# Patient Record
Sex: Female | Born: 1935 | Race: White | Hispanic: No | Marital: Married | Smoking: Former smoker
Health system: Southern US, Community
[De-identification: ages and names within clinical notes are randomized; demographics above are authoritative.]

## PROBLEM LIST (undated history)

## (undated) DIAGNOSIS — H8110 Benign paroxysmal vertigo, unspecified ear: Secondary | ICD-10-CM

## (undated) DIAGNOSIS — M810 Age-related osteoporosis without current pathological fracture: Secondary | ICD-10-CM

## (undated) DIAGNOSIS — G250 Essential tremor: Secondary | ICD-10-CM

## (undated) DIAGNOSIS — K219 Gastro-esophageal reflux disease without esophagitis: Secondary | ICD-10-CM

## (undated) DIAGNOSIS — K644 Residual hemorrhoidal skin tags: Secondary | ICD-10-CM

## (undated) DIAGNOSIS — M4850XA Collapsed vertebra, not elsewhere classified, site unspecified, initial encounter for fracture: Secondary | ICD-10-CM

## (undated) DIAGNOSIS — R350 Frequency of micturition: Secondary | ICD-10-CM

## (undated) DIAGNOSIS — F0280 Dementia in other diseases classified elsewhere without behavioral disturbance: Secondary | ICD-10-CM

## (undated) DIAGNOSIS — R911 Solitary pulmonary nodule: Secondary | ICD-10-CM

## (undated) DIAGNOSIS — I10 Essential (primary) hypertension: Secondary | ICD-10-CM

## (undated) DIAGNOSIS — E559 Vitamin D deficiency, unspecified: Secondary | ICD-10-CM

## (undated) DIAGNOSIS — M8448XA Pathological fracture, other site, initial encounter for fracture: Secondary | ICD-10-CM

## (undated) DIAGNOSIS — G309 Alzheimer's disease, unspecified: Secondary | ICD-10-CM

## (undated) DIAGNOSIS — F329 Major depressive disorder, single episode, unspecified: Secondary | ICD-10-CM

## (undated) DIAGNOSIS — F411 Generalized anxiety disorder: Secondary | ICD-10-CM

## (undated) DIAGNOSIS — B029 Zoster without complications: Secondary | ICD-10-CM

## (undated) DIAGNOSIS — K5730 Diverticulosis of large intestine without perforation or abscess without bleeding: Secondary | ICD-10-CM

## (undated) DIAGNOSIS — R079 Chest pain, unspecified: Secondary | ICD-10-CM

## (undated) DIAGNOSIS — G43001 Migraine without aura, not intractable, with status migrainosus: Secondary | ICD-10-CM

## (undated) DIAGNOSIS — R269 Unspecified abnormalities of gait and mobility: Secondary | ICD-10-CM

## (undated) DIAGNOSIS — E7800 Pure hypercholesterolemia, unspecified: Secondary | ICD-10-CM

## (undated) HISTORY — PX: CARPAL TUNNEL RELEASE: SHX101

## (undated) HISTORY — DX: Solitary pulmonary nodule: R91.1

## (undated) HISTORY — DX: Vitamin D deficiency, unspecified: E55.9

## (undated) HISTORY — DX: Gastro-esophageal reflux disease without esophagitis: K21.9

## (undated) HISTORY — PX: BILATERAL SALPINGOOPHORECTOMY: SHX1223

## (undated) HISTORY — DX: Unspecified abnormalities of gait and mobility: R26.9

## (undated) HISTORY — DX: Frequency of micturition: R35.0

## (undated) HISTORY — DX: Pathological fracture, other site, initial encounter for fracture: M84.48XA

## (undated) HISTORY — DX: Generalized anxiety disorder: F41.1

## (undated) HISTORY — DX: Migraine without aura, not intractable, with status migrainosus: G43.001

## (undated) HISTORY — DX: Age-related osteoporosis without current pathological fracture: M81.0

## (undated) HISTORY — DX: Zoster without complications: B02.9

## (undated) HISTORY — DX: Essential tremor: G25.0

## (undated) HISTORY — DX: Chest pain, unspecified: R07.9

## (undated) HISTORY — DX: Diverticulosis of large intestine without perforation or abscess without bleeding: K57.30

## (undated) HISTORY — DX: Residual hemorrhoidal skin tags: K64.4

## (undated) HISTORY — DX: Collapsed vertebra, not elsewhere classified, site unspecified, initial encounter for fracture: M48.50XA

## (undated) HISTORY — DX: Benign paroxysmal vertigo, unspecified ear: H81.10

## (undated) HISTORY — DX: Major depressive disorder, single episode, unspecified: F32.9

---

## 1999-11-28 ENCOUNTER — Encounter: Payer: Self-pay | Admitting: Internal Medicine

## 1999-11-28 ENCOUNTER — Encounter: Admission: RE | Admit: 1999-11-28 | Discharge: 1999-11-28 | Payer: Self-pay | Admitting: Internal Medicine

## 2000-11-05 ENCOUNTER — Other Ambulatory Visit: Admission: RE | Admit: 2000-11-05 | Discharge: 2000-11-05 | Payer: Self-pay | Admitting: Internal Medicine

## 2000-11-18 ENCOUNTER — Encounter: Payer: Self-pay | Admitting: Internal Medicine

## 2000-11-18 ENCOUNTER — Encounter: Admission: RE | Admit: 2000-11-18 | Discharge: 2000-11-18 | Payer: Self-pay | Admitting: Internal Medicine

## 2000-11-29 ENCOUNTER — Encounter: Payer: Self-pay | Admitting: Internal Medicine

## 2000-11-29 ENCOUNTER — Encounter: Admission: RE | Admit: 2000-11-29 | Discharge: 2000-11-29 | Payer: Self-pay | Admitting: Internal Medicine

## 2001-05-13 ENCOUNTER — Encounter: Payer: Self-pay | Admitting: Internal Medicine

## 2001-05-13 ENCOUNTER — Inpatient Hospital Stay (HOSPITAL_COMMUNITY): Admission: EM | Admit: 2001-05-13 | Discharge: 2001-05-14 | Payer: Self-pay

## 2001-05-14 ENCOUNTER — Encounter: Payer: Self-pay | Admitting: Internal Medicine

## 2001-12-08 ENCOUNTER — Encounter: Payer: Self-pay | Admitting: Internal Medicine

## 2001-12-08 ENCOUNTER — Encounter: Admission: RE | Admit: 2001-12-08 | Discharge: 2001-12-08 | Payer: Self-pay | Admitting: Internal Medicine

## 2002-02-26 DIAGNOSIS — G250 Essential tremor: Secondary | ICD-10-CM

## 2002-02-26 HISTORY — DX: Essential tremor: G25.0

## 2002-10-05 ENCOUNTER — Encounter: Admission: RE | Admit: 2002-10-05 | Discharge: 2002-12-30 | Payer: Self-pay | Admitting: Neurology

## 2002-12-10 ENCOUNTER — Encounter: Admission: RE | Admit: 2002-12-10 | Discharge: 2002-12-10 | Payer: Self-pay | Admitting: Family Medicine

## 2002-12-10 ENCOUNTER — Encounter: Payer: Self-pay | Admitting: Family Medicine

## 2003-02-25 ENCOUNTER — Encounter: Admission: RE | Admit: 2003-02-25 | Discharge: 2003-02-25 | Payer: Self-pay | Admitting: Family Medicine

## 2003-03-22 ENCOUNTER — Encounter: Payer: Self-pay | Admitting: Internal Medicine

## 2003-12-21 ENCOUNTER — Ambulatory Visit (HOSPITAL_COMMUNITY): Admission: RE | Admit: 2003-12-21 | Discharge: 2003-12-21 | Payer: Self-pay | Admitting: Family Medicine

## 2003-12-24 ENCOUNTER — Ambulatory Visit: Payer: Self-pay | Admitting: Family Medicine

## 2004-01-03 ENCOUNTER — Ambulatory Visit: Payer: Self-pay | Admitting: Family Medicine

## 2004-01-05 ENCOUNTER — Ambulatory Visit: Payer: Self-pay | Admitting: Family Medicine

## 2004-01-20 HISTORY — PX: COLONOSCOPY: SHX174

## 2004-02-15 ENCOUNTER — Encounter: Admission: RE | Admit: 2004-02-15 | Discharge: 2004-02-15 | Payer: Self-pay | Admitting: Family Medicine

## 2004-02-27 DIAGNOSIS — B029 Zoster without complications: Secondary | ICD-10-CM

## 2004-02-27 HISTORY — DX: Zoster without complications: B02.9

## 2004-05-02 ENCOUNTER — Encounter: Admission: RE | Admit: 2004-05-02 | Discharge: 2004-05-02 | Payer: Self-pay | Admitting: Neurosurgery

## 2004-05-23 ENCOUNTER — Inpatient Hospital Stay (HOSPITAL_COMMUNITY): Admission: RE | Admit: 2004-05-23 | Discharge: 2004-05-25 | Payer: Self-pay | Admitting: Neurosurgery

## 2005-01-08 ENCOUNTER — Other Ambulatory Visit: Admission: RE | Admit: 2005-01-08 | Discharge: 2005-01-08 | Payer: Self-pay | Admitting: Family Medicine

## 2005-07-06 ENCOUNTER — Ambulatory Visit (HOSPITAL_COMMUNITY): Admission: RE | Admit: 2005-07-06 | Discharge: 2005-07-06 | Payer: Self-pay | Admitting: Family Medicine

## 2006-06-11 ENCOUNTER — Encounter (INDEPENDENT_AMBULATORY_CARE_PROVIDER_SITE_OTHER): Payer: Self-pay | Admitting: *Deleted

## 2006-06-11 ENCOUNTER — Inpatient Hospital Stay (HOSPITAL_COMMUNITY): Admission: EM | Admit: 2006-06-11 | Discharge: 2006-06-14 | Payer: Self-pay | Admitting: Emergency Medicine

## 2006-06-14 ENCOUNTER — Encounter (INDEPENDENT_AMBULATORY_CARE_PROVIDER_SITE_OTHER): Payer: Self-pay | Admitting: *Deleted

## 2006-06-18 ENCOUNTER — Ambulatory Visit: Payer: Self-pay | Admitting: Internal Medicine

## 2006-06-24 ENCOUNTER — Ambulatory Visit: Payer: Self-pay | Admitting: Internal Medicine

## 2006-07-09 ENCOUNTER — Encounter: Payer: Self-pay | Admitting: Internal Medicine

## 2006-07-09 ENCOUNTER — Inpatient Hospital Stay (HOSPITAL_COMMUNITY): Admission: EM | Admit: 2006-07-09 | Discharge: 2006-07-11 | Payer: Self-pay | Admitting: Emergency Medicine

## 2006-08-28 ENCOUNTER — Encounter (INDEPENDENT_AMBULATORY_CARE_PROVIDER_SITE_OTHER): Payer: Self-pay | Admitting: Obstetrics and Gynecology

## 2006-08-29 ENCOUNTER — Encounter (INDEPENDENT_AMBULATORY_CARE_PROVIDER_SITE_OTHER): Payer: Self-pay | Admitting: Obstetrics and Gynecology

## 2006-08-29 ENCOUNTER — Ambulatory Visit (HOSPITAL_COMMUNITY): Admission: RE | Admit: 2006-08-29 | Discharge: 2006-08-30 | Payer: Self-pay | Admitting: Obstetrics and Gynecology

## 2007-09-09 IMAGING — CR DG ELBOW 2V*L*
2 series · 2 of 2 positions shown · non-contrast
Comparison: none

CLINICAL DATA: Post-op fracture proximal ulna.  
 LEFT ELBOW ? 2 VIEW:

[view not recorded (1 of 2)]
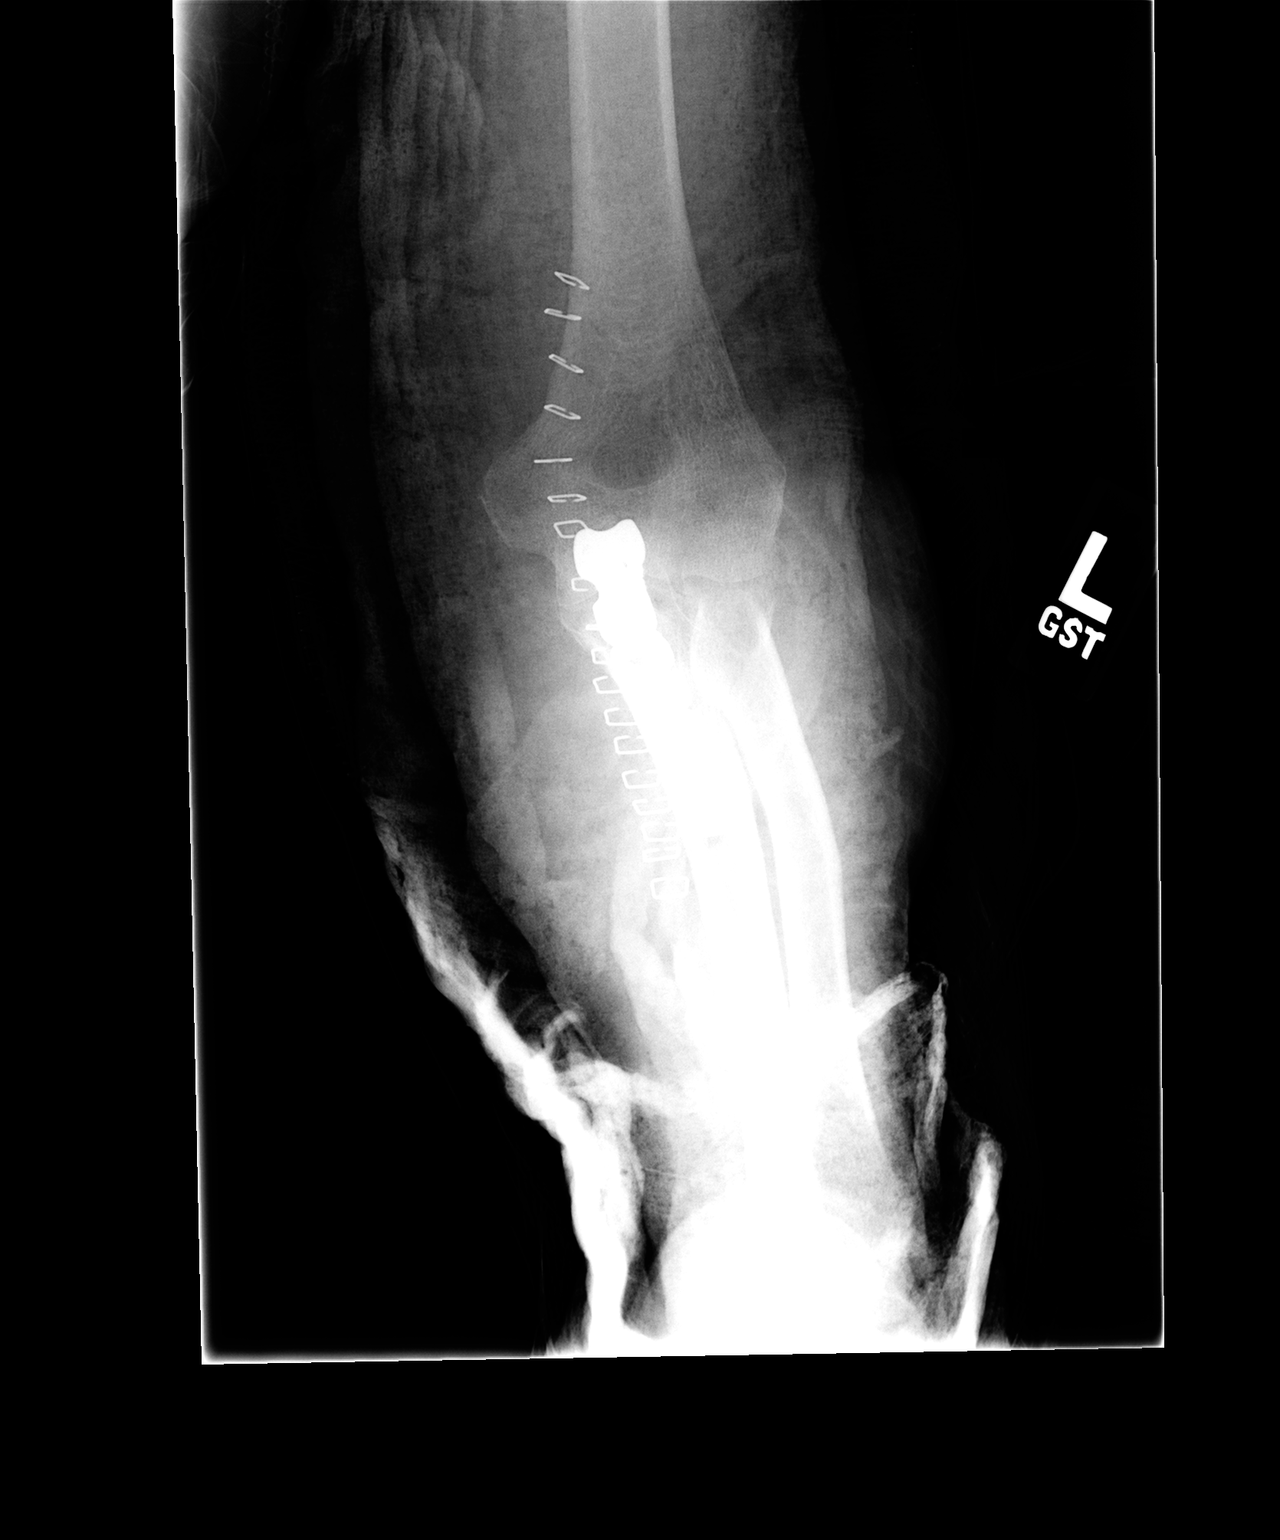

[view not recorded (2 of 2)]
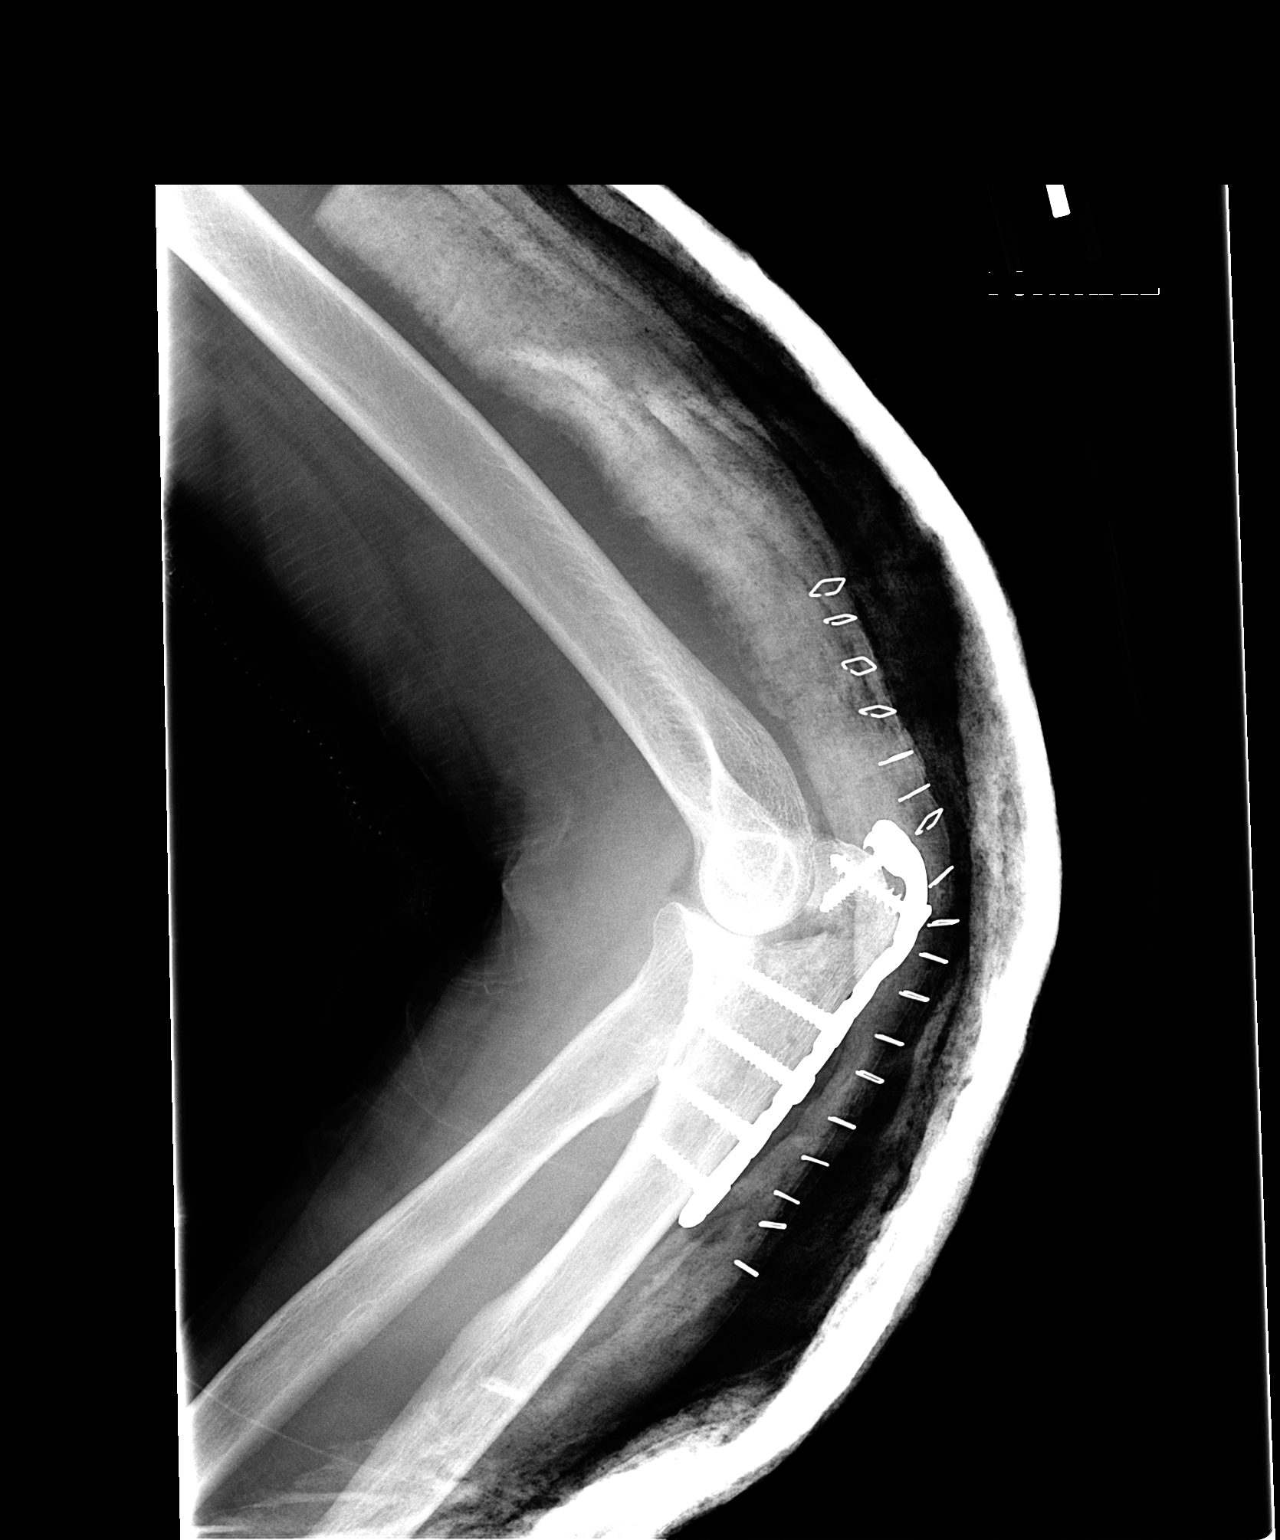

[2 of 2 positions shown; findings below may reference images not displayed]

FINDINGS: Overlying plaster splint obscures some detail.  There is internal plate and screw fixation traversing a proximal ulna fracture in near anatomic alignment and position.  No gross complicating features are identified.
IMPRESSION: Internal fixation of proximal ulnar fracture.

## 2007-09-09 IMAGING — CR DG CHEST 1V PORT
1 series · 1 of 1 positions shown · non-contrast
Comparison: 05/19/04

CLINICAL DATA: Pre-op.  Broken elbow.
 PORTABLE CHEST- 1 VIEW (0302 hours):

[view not recorded]
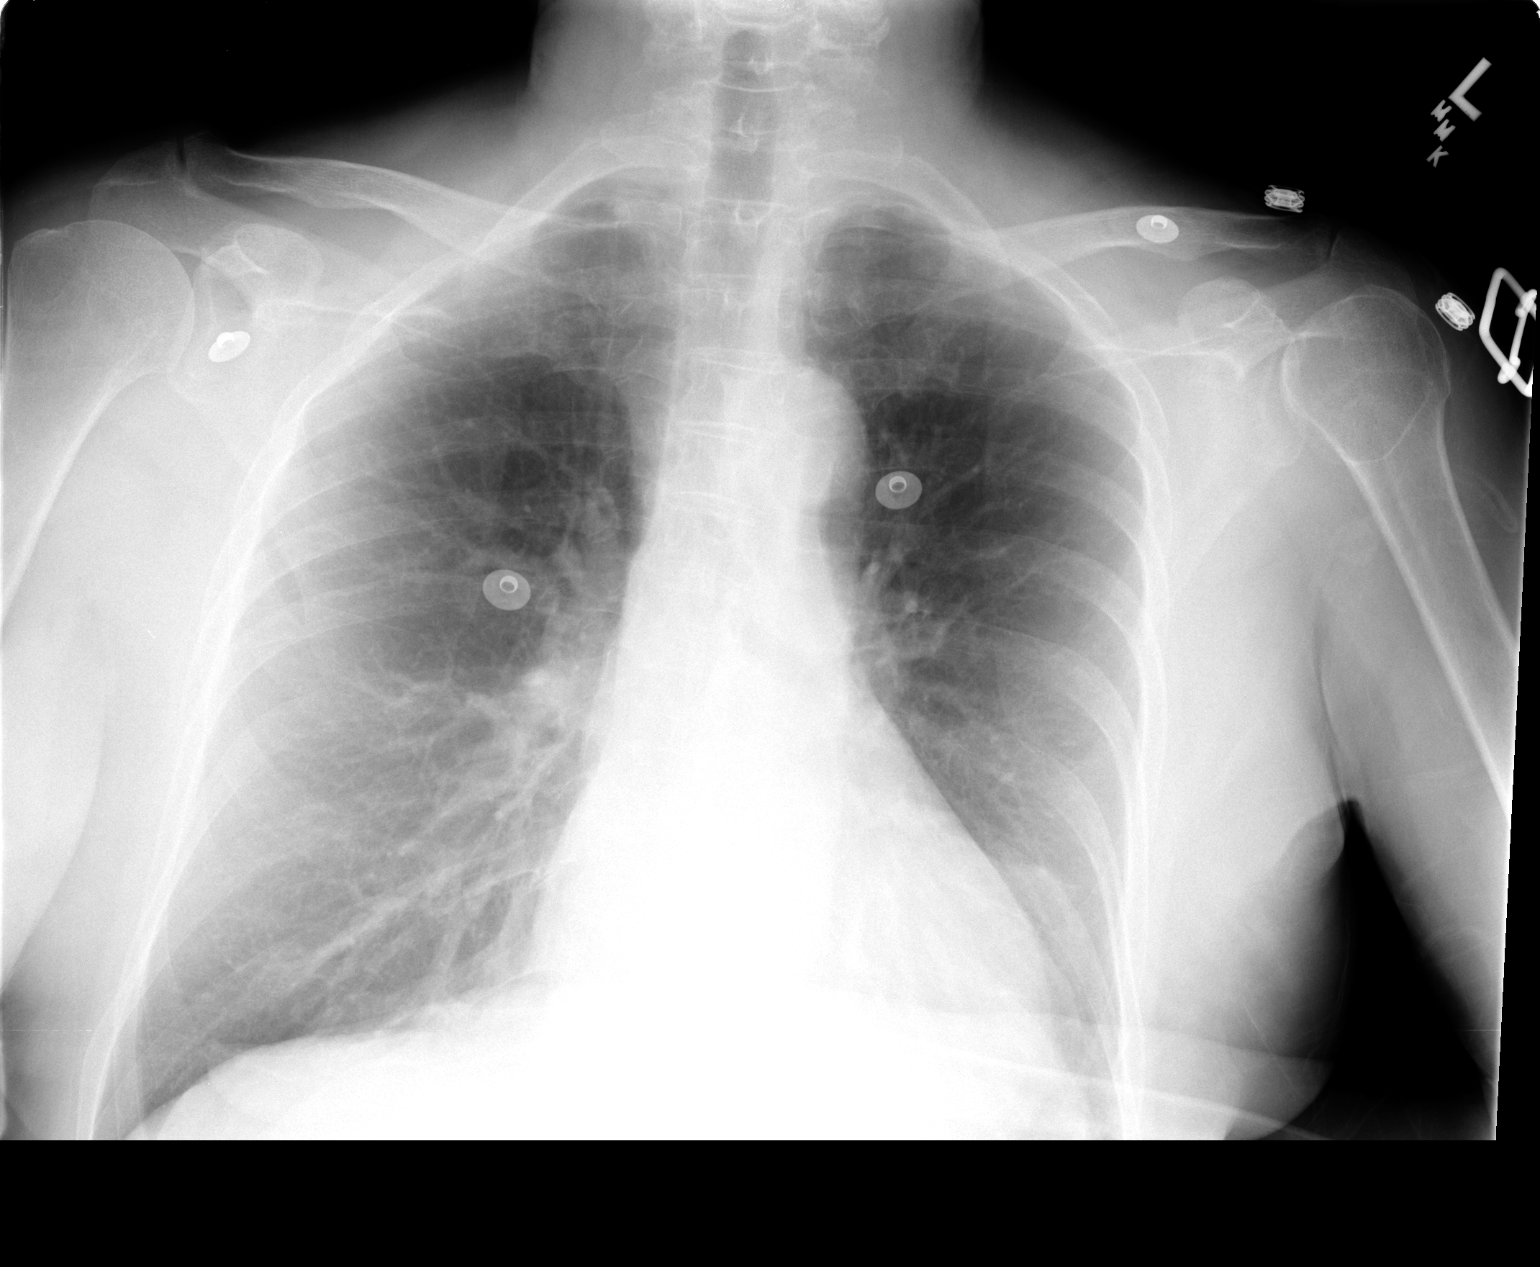

[1 of 1 positions shown; findings below may reference images not displayed]

FINDINGS: The heart is mildly enlarged.  Pulmonary vascularity is within normal limits.  Lungs are clear.  No pneumothoraces or effusions are seen.
IMPRESSION: Cardiomegaly without CHF.

## 2007-10-07 ENCOUNTER — Encounter: Admission: RE | Admit: 2007-10-07 | Discharge: 2007-10-07 | Payer: Self-pay | Admitting: Internal Medicine

## 2008-07-16 ENCOUNTER — Encounter: Admission: RE | Admit: 2008-07-16 | Discharge: 2008-07-16 | Payer: Self-pay | Admitting: Internal Medicine

## 2008-09-17 DIAGNOSIS — Z8679 Personal history of other diseases of the circulatory system: Secondary | ICD-10-CM

## 2008-09-17 DIAGNOSIS — E785 Hyperlipidemia, unspecified: Secondary | ICD-10-CM

## 2008-09-17 DIAGNOSIS — Z860100 Personal history of colon polyps, unspecified: Secondary | ICD-10-CM | POA: Insufficient documentation

## 2008-09-17 DIAGNOSIS — K5730 Diverticulosis of large intestine without perforation or abscess without bleeding: Secondary | ICD-10-CM | POA: Insufficient documentation

## 2008-09-17 DIAGNOSIS — M810 Age-related osteoporosis without current pathological fracture: Secondary | ICD-10-CM

## 2008-09-17 DIAGNOSIS — G250 Essential tremor: Secondary | ICD-10-CM

## 2008-09-17 DIAGNOSIS — Z8719 Personal history of other diseases of the digestive system: Secondary | ICD-10-CM

## 2008-09-17 DIAGNOSIS — G252 Other specified forms of tremor: Secondary | ICD-10-CM

## 2008-09-22 ENCOUNTER — Ambulatory Visit: Payer: Self-pay | Admitting: Internal Medicine

## 2009-07-19 ENCOUNTER — Encounter: Admission: RE | Admit: 2009-07-19 | Discharge: 2009-07-19 | Payer: Self-pay | Admitting: Internal Medicine

## 2010-03-19 ENCOUNTER — Encounter: Payer: Self-pay | Admitting: Family Medicine

## 2010-03-19 ENCOUNTER — Encounter: Payer: Self-pay | Admitting: Neurosurgery

## 2010-07-11 NOTE — H&P (Signed)
NAMEDAVIONNE, MASTRANGELO NO.:  [id number]   MEDICAL RECORD NO.:  [id number]          PATIENT TYPE:  INP   LOCATION:  1845                         FACILITY:  [facility]   PHYSICIAN:  Burnard Bunting, M.D.    DATE OF BIRTH:  October 08, 1926   DATE OF ADMISSION:  07/09/2006  DATE OF DISCHARGE:                              HISTORY & PHYSICAL   CHIEF COMPLAINT:  Left elbow pain.   HISTORY OF PRESENT ILLNESS:  Dominique Martin is a 77 year old female who  fell today around 4 o'clock on her left elbow.  She denies any loss of  consciousness.  She was actually states that she was clumsy.  She was  seen in the urgent care and diagnosed with an olecranon fracture.  She  denies any other orthopedic complaints.   PAST MEDICAL HISTORY:  Notable for vertigo and osteoporosis.   PAST SURGICAL HISTORY:  Noncontributory.   CURRENT MEDICATIONS:  Actonel and Zocor.   ALLERGIES:  She is ALLERGIC to PENICILLIN.   The patient lives with her husband in [place], and she is retired.  She is a nonsmoker, denies any drug use but does have reported alcohol  use.  She does have a 20-pack-year smoking but quit many years ago.   PHYSICAL EXAMINATION:  VITAL SIGNS:  Her temp is 96.9, heart rate is 89,  blood pressure 136/86, respirations 18, pulse oximetry shows 99% sat on  room air.  CHEST:  Clear to auscultation.  HEART:  Regular rate and rhythm.  ABDOMINAL EXAM:  Benign.  MUSCULOSKELETAL:  She has good cervical spine range of motion.  She has  a full range of motion of bilateral lower extremities, hips, knees, and  ankles.  Pedal pulses are intact.  Right upper extremity demonstrates  full range of motion of shoulder, elbow, and wrist.  Left upper  extremity demonstrates full range of motion of the wrist and shoulder.  She has swelling around the elbow.  Radial pulse is intact.  EPL, FPL,  interosseous function is intact.  She has a punctate laceration just  proximal to the normal location of the  olecranon tip.   Radiographs do show a displaced olecranon fracture which is comminuted.   IMPRESSION:  Comminuted displaced olecranon fracture.   PLAN:  Open reduction internal fixation.  Risks and benefits are  discussed with the patient which include but not limited to infection,  nonunion, malunion and elbow stiffness, the need for more surgery,  including hardware removal.  All questions answered.      Burnard Bunting, M.D.  Electronically Signed     GSD/MEDQ  D:  07/09/2006  T:  07/09/2006  Job:  [id number]

## 2010-07-11 NOTE — Discharge Summary (Signed)
Dominique Martin, Dominique Martin              ACCOUNT NO.:  [id number]   MEDICAL RECORD NO.:  [id number]          PATIENT TYPE:  OIB   LOCATION:  9309                          FACILITY:  [facility]   PHYSICIAN:  Sherron Monday, MD        DATE OF BIRTH:  1926/04/15   DATE OF ADMISSION:  08/29/2006  DATE OF DISCHARGE:  08/30/2006                               DISCHARGE SUMMARY   PROCEDURE:  Operative laparoscopy, bilateral salpingo-oophorectomy with  pelvic washings.   ADMISSION DIAGNOSES:  Status post operative laparoscopy, bilateral  salpingo-oophorectomy, with benign positional paroxysmal vertigo and a  familial tremor and difficulty postoperatively.   POSTOPERATIVE DIAGNOSES:  Status post operative laparoscopy, bilateral  salpingo-oophorectomy, with benign positional paroxysmal vertigo and a  familial tremor and difficulty postoperatively.   HISTORY:  Patient is a 77 year old Caucasian female with complex ovarian  cyst, who was recently admitted with colitis.  A CT was performed which  found a cyst on her ovary.  It was evaluated with an ultrasound of the  ovary and then further evaluated in several months with a repeat  ultrasound.  Found a normal uterus and a normal left ovary.  However, an  enlarged right ovary, 2.96 x 2.62 x 2.99.  It was described as  polycystic with solid areas and echo patterns that are irregular and no  abnormal.  There was no ascites noticed on this ultrasound, a normal  CA125 and no other symptoms.   PAST MEDICAL HISTORY:  Significant for vertigo, benign paroxysmal  positional vertigo, familial tremor, osteopenia.   PAST SURGICAL HISTORY:  Appendectomy, hemorrhoidectomy, herniated disc  repair and elbow surgery.   ALLERGIES:  PENICILLIN, which causes itching.   MEDICATIONS:  At the time of her initial appointment were Cipro and  Flagyl, secondary to colitis, and Actonel.   PAST OB/GYN HISTORY:  She is a G5, P3 with two miscarriages and three  term deliveries, all  female infants.  She is up to date on her mammogram.  She is not sexually active.  No problems with postmenopausal bleeding,  hot flashes or night sweats.  Menopause in 1980.  Menarche at age 52.  No history of any abnormal Pap smears.  No history of any sexually-  transmitted diseases.   SOCIAL HISTORY:  She denies alcohol, tobacco or drug use.  She is  married and retired.   She has a benign exam.  She underwent her surgery without complication  on July 3.  She had minimal EBL.  She was observed for 24 hours.  On the  morning of July 4, she said that she felt well.  Her vertigo had been  exacerbated by the anesthesia, but was coming down and she felt  comfortable going home.  She had no complaints.  Her pain was  controlled.  She was given routine discharge instructions and numbers to  call with any questions or problems, as well as a prescription for  Motrin and a prescription for Vicodin.  At the time of discharge, her  hemoglobin had  decreased from 14.9 to 11.6.  Her exam was benign with  a soft,  appropriately tender abdomen with good bowel sounds.  Her incisions were  clean, dry and intact.  Her umbilical incision had some bruising, but  was within normal limits.      Sherron Monday, MD  Electronically Signed     JB/MEDQ  D:  08/30/2006  T:  08/30/2006  Job:  [id number]

## 2010-07-11 NOTE — Op Note (Signed)
Dominique Martin, Dominique Martin              ACCOUNT NO.:  [id number]   MEDICAL RECORD NO.:  [id number]          PATIENT TYPE:  OIB   LOCATION:  9309                          FACILITY:  [facility]   PHYSICIAN:  Sherron Monday, MD        DATE OF BIRTH:  Jun 26, 1926   DATE OF PROCEDURE:  08/29/2006  DATE OF DISCHARGE:                               OPERATIVE REPORT   PREOPERATIVE DIAGNOSIS:  Complex ovarian mass, normal CA-125.   POSTOPERATIVE DIAGNOSIS:  Complex ovarian mass, normal CA-125.   PROCEDURE:  Operative laparoscopy, bilateral salpingectomy-oophorectomy,  pelvic washings.   SURGEON:  Sherron Monday, M.D.   ASSISTANT:  Zenaida Niece, M.D.   ANESTHESIA:  General as well as 15 mL of local anesthesia with 0.25%  Marcaine.   SPECIMENS:  Bilateral tubes and ovaries.   ESTIMATED BLOOD LOSS:  Minimal.   URINE OUTPUT:  She had I&O catheterization prior to the procedure.   IV FLUIDS:  1200 mL.   COMPLICATIONS:  None.   DISPOSITION:  Stable to PACU following the procedure.   PROCEDURE:  After informed consent was reviewed with the patient  including risks, benefits and alternatives of the procedure were  discussed including the indications being a stable complex ovarian cyst  with normal CA-125.  She was taken to the operating room, placed on the  table in supine position.  General anesthesia was induced and found to  be adequate.  She was then placed in Harper stirrups, prepped and draped  in the normal sterile fashion.  Her bladder was drained. Using an open-  sided speculum a uterine manipulator was placed, the gloves were  changed, and attention was then turned to the abdominal portion of the  procedure. Approximately 15 mm infraumbilical incision was made using  Kocher clamps and the fascia was elevated and incised with Mayo  scissors.  The peritoneal cavity was noted to be entered at this point.  Stay sutures were placed with the UR6 needle 0 Vicryl at the bilateral  corners. The  Hasson trocar was placed and anchored using the stay  sutures. The pneumoperitoneum was obtained. Trendelenburg position and  pelvic survey revealed an enlarged right ovary and a normal-appearing  left ovary.  No other abnormalities were noted. Liver edge and  gallbladder were visualized. The left and right accessory ports were  placed after good visualization of the epigastric arteries and then  placed under direct visualization. Using a blunt retractor and using the  atraumatic graspers the tube and ovary were isolated from the left side  using the Ace Harmonic. It was then ligated in a sterile fashion and the  tube and ovary were placed in the posterior cul-de-sac.  Attention was  turned to performing the right salpingo-oophorectomy. The tube was  grasped with the atraumatic graspers and again the tube and ovary were  excised using Ace Harmonic. Hemostasis was assured.  The 5 mm camera was  placed in the accessory port and the EndoCatch bag was placed through  the 10 mm that had been placed infraumbilically. The specimens were  scooped into the bag, the  bag was removed after extension of both the  fascial incision and the skin incision. The umbilical incision was  closed using the stay sutures from the corner. A pelvic survey was  performed again and hemostasis was assured.  The accessory ports were  removed under direct visualization after the pneumoperitoneum had been  drained. The accessory ports were closed with the skin was closed with  subcuticular fashion with 4-0 Vicryl and Benzoin and Steri-Strips were  applied.  A deep suture of 3-0 Vicryl was placed at the umbilicus.  The  skin was closed with 4-0 Vicryl. The skin was reapproximated with  subcuticular with 4-0 Vicryl and Benzoin and Steri-Strips were applied.  Sponge, lap and needle counts were correct x2.  The patient tolerated  the procedure well and was taken to the PACU following the procedure.  Prior to placement of the  accessory ports they were infiltrated with  Marcaine and after removal of the umbilical port it was also infiltrated  with Marcaine for a total of approximately 15 mL.      Sherron Monday, MD  Electronically Signed     JB/MEDQ  D:  08/29/2006  T:  08/29/2006  Job:  [id number]

## 2010-07-11 NOTE — H&P (Signed)
NAMELYNETTA, TOMCZAK NO.:  [id number]   MEDICAL RECORD NO.:  [id number]          PATIENT TYPE:  AMB   LOCATION:  SDC                           FACILITY:  [facility]   PHYSICIAN:  Sherron Monday, MD        DATE OF BIRTH:  [birth year]   DATE OF ADMISSION:  DATE OF DISCHARGE:                              HISTORY & PHYSICAL   ADMITTING DIAGNOSIS:  Complex ovarian cyst.   SURGERY PLAN:  Laparoscopy, bilateral salpingo-oophorectomy.   HISTORY OF PRESENT ILLNESS:  A 77 year old Caucasian female with ovarian  cyst.  She was recently hospitalized with colitis and had a CT which  found a cyst on her ovary.  She then had it further evaluated with a  transvaginal ultrasound on the 18th of April, measuring normal uterus,  endometrial stripe was within normal limits.  The left ovary was within  normal limits, and right ovary was at 2.2 x 2.6 x 1.9 cm cyst.  She was  followed up in our office on June 2 with a uterus that was 2.8 cm x 2.4  cm x 1.87 cm and endometrial thickness of 2.11 mm.  Her left ovary was  1.0 x 1.6 x 0.8, and her right ovary was 3.96 x 2.63 x 3.99.  It was  described as pneumocystic and solid areas with echo patterns that are  irregular and abnormal.  The largest cystic area measured 17 x 24 mm.  No ascites was noticed on this ultrasound.  She denies constipation,  bloating, or systemic pain.   PAST MEDICAL HISTORY:  1. Vertigo.  2. Familial tremor.  3. Osteopenia.   PAST SURGICAL HISTORY:  1. Appendectomy in 1942.  2. Hemorrhoidectomy in 1972.  3. Herniated disk repair in 1994 and 2005.  4. She also recently had elbow surgery secondary to breaking her      elbow.   ALLERGIES:  PENICILLIN WHICH CAUSES SWELLING.   MEDICATIONS:  At the time of her initial appointment:  1. Cipro twice daily secondary to colitis.  2. Flagyl t.i.d.  Flagyl secondary to colitis.  3. Actonel   PAST OB/GYN HISTORY:  1. She has no abnormal Pap smears.  2. She has not had Pap  smear in awhile.  3. No sexually transmitted diseases.  4. Menarche was at age 53 with regular cycles.  5. She had menopause in 1980.  6. She has no postmenopausal bleeding, hot flashes, or night sweats.  7. She is not sexually active.  8. She is a G5 P3 with 2 miscarriages and 3 term deliveries, all female      infants.  9. She is up to date on her mammogram.   SOCIAL HISTORY:  She denies tobacco use.  She quit 20 years ago.  She  has 1-2 drinks a day, no other illicit drug use.  She is married and  retired.   PHYSICAL EXAMINATION:  VITAL SIGNS:  She is 5 feet 4 inches, weighs 132  pounds.  GENERAL:  No acute distress.  HEENT:  Mucous membranes are moist.  NECK:  No lymphadenopathy, thyroid.  HEART:  Regular rate and rhythm.  LUNGS:  Clear to auscultation bilaterally.  BACK:  No costovertebral angle tenderness.  BREASTS:  B cup, no masses, no tenderness, no distortion.  ABDOMEN:  Soft, nontender, nondistended, and positive bowel sounds.  EXTREMITIES:  The extremities are symmetric and nontender.  PELVIC:  Normal external female genitalia.  Normal BUS.  This is noted  to be atrophic.  Normal cervix and vagina which are also atrophic.  No  cervical motion tenderness.  BIMANUAL EXAM:  Normal uterus, normal adnexa.  Fullness is noted on the  right.   After her initial appointment, she had a CA 125 checked which was found  to be 22.8 within normal limits.  I discussed with the patient and her  husband ultrasound results with the complex ovarian cyst that was stable  in size and a normal CA 125.  We discussed the risk of an ovarian cancer  versus a low malignant potential tumor as well as a plan for  laparoscopy, bilateral salpingo-oophorectomy with washings.  We  discussed that if the cyst appeared to be malignant, we would stop  surgery and refer to gynecology/oncology specialist.  We think this is a  low likelihood.  The patient obtained a second opinion, and she decided  to  proceed with the operative laparoscopy, bilateral salpingo-  oophorectomy which will be performed on the 3rd of July.  Her questions  and her husband's questions were answered prior to proceeding with this  surgery and again, the risks, benefits, and alternatives of the surgical  procedure were discussed.      Sherron Monday, MD  Electronically Signed     JB/MEDQ  D:  08/28/2006  T:  08/28/2006  Job:  [id number]

## 2010-07-11 NOTE — Op Note (Signed)
NAMEEMIAH, PELLICANO NO.:  [id number]   MEDICAL RECORD NO.:  [id number]          PATIENT TYPE:  INP   LOCATION:  5003                         FACILITY:  [facility]   PHYSICIAN:  Burnard Bunting, M.D.    DATE OF BIRTH:  [birth year]   DATE OF PROCEDURE:  07/09/2006  DATE OF DISCHARGE:  06/14/2006                               OPERATIVE REPORT   PREOPERATIVE DIAGNOSIS:  Grade 1 open left elbow olecranon fracture.   POSTOPERATIVE DIAGNOSIS:  Grade 1 open left elbow olecranon fracture.   PROCEDURES:  1. Left elbow olecranon fracture open reduction and internal fixation.  2. Debridement of skin, subcutaneous tissue, muscle, fascia and bone      from open fracture.   ATTENDING SURGEON:  Burnard Bunting, M.D.   ASSISTANT:  Jerolyn Shin. Tresa Res, M.D.   ANESTHESIA:  General endotracheal.   ESTIMATED BLOOD LOSS:  50 mL.   DRAINS:  None.   TOURNIQUET TIME:  Approximately 56 minutes at 250 mmHg.   INDICATIONS:  Dominique Martin is a 77 year old  right-hand-dominant  female, who fell on her left elbow.  She did sustain a grade 1 open  olecranon fracture and presents now for operative management after  explanation of risks and benefits, which include but are not limited to  infection, nerve and vessel damage, nonunion, malunion, need for more  surgery, including hardware removal.   PROCEDURES IN DETAIL:  The patient was brought to the operating room,  where general endotracheal anesthesia was induced.  Preoperative  antibiotics were administered.  The left arm was prepped with DuraPrep  solution and draped in a sterile manner.  A sterile tourniquet was  utilized.  Collier Flowers was utilized to cover the operative field.  A puncture  wound on the lateral side of the olecranon tip was explored.  The skin,  subcutaneous tissue, muscle and fascia were debrided.  The tourniquet  was then elevated and exsanguinated with an Esmarch wrap.  The  tourniquet was inflated.  An incision was made  over the olecranon tip  and extended proximally and distally.  Bone was debrided at this time as  the fracture site was visualized.  Several comminuted pieces within the  joint were irrigated and removed.  Following irrigation and excisional  debridement, the distal ulna was exposed.  A [employer] olecranon  plate was applied with good purchase obtained with good fracture  reduction.  The fracture was reduced and held with a clamp and the plate  was applied.  Three screws were placed in the proximal fragment, 4  screws in the distal fragment.  The arm was taken through a range of  motion.  There were no grinding or crepitus and the estimated blood loss  was stable.  One small articular fragment piece was held in position  with sutures through drill holes.  At this time, the tourniquet was  released and bleeding points were encountered and controlled  using electrocautery.  The skin was closed using interrupted, inverted 2-  0 Vicryl suture, followed by interrupted 3-0 interrupted by skin  staples.  The area of  the open fracture, which was about 2 cm away from  the incision, was reapproximated loosely with two 3-0 nylon sutures.  A  bulky posterior splint was applied.      Burnard Bunting, M.D.  Electronically Signed     GSD/MEDQ  D:  07/10/2006  T:  07/10/2006  Job:  [id number]

## 2010-07-12 ENCOUNTER — Other Ambulatory Visit: Payer: Self-pay | Admitting: Internal Medicine

## 2010-07-12 DIAGNOSIS — Z1231 Encounter for screening mammogram for malignant neoplasm of breast: Secondary | ICD-10-CM

## 2010-07-14 NOTE — Op Note (Signed)
NAMEMAGGI, HERSHKOWITZ NO.:  [id number]   MEDICAL RECORD NO.:  [id number]          PATIENT TYPE:  INP   LOCATION:  2899                         FACILITY:  [facility]   PHYSICIAN:  Payton Doughty, M.D.      DATE OF BIRTH:  January 24, 1927   DATE OF PROCEDURE:  05/23/2004  DATE OF DISCHARGE:                                 OPERATIVE REPORT   PREOPERATIVE DIAGNOSIS:  Herniated foraminal disc at L3-4 on the left, and  bilateral L4-5 stenosis.   POSTOPERATIVE DIAGNOSIS:  Herniated foraminal disc at L3-4 on the left, and  bilateral L4-5 stenosis.   OPERATIVE PROCEDURE:  L3-4 foraminal diskectomy, L4-5 laminotomy,  foraminotomy done bilaterally.   SURGEON:  Payton Doughty, M.D.   ANESTHESIA:  General endotracheal.   PREP:  Sterile __________ prep and scrub with alcohol wipe.   COMPLICATIONS:  None.   NURSE ASSISTANT:  [place].   DOCTOR ASSISTANT:  Coletta Memos, M.D.   BODY OF TEXT:  This is a 77 year old lady with left radiculopathy,  spondylosis at L4-5, and a foraminal disc at L3-4.  She was taken to the  operating room smoothly anesthetized, intubated, and placed prone on the  operating table.  Following shave, prep, and drape in the usual sterile  fashion, the skin was incised from the bottom of L2 through the bottom of  L4, and the left-side lamina of L3 and L4, the transverse process of L3 and  L4, and the right-side lamina of L4 were exposed bilaterally in the  subperiosteal plane.  Intraoperative x-ray confirmed correctness of level.  The pars reticularis of L3 was drilled on the left side down to the  ligamentum flavum that was removed.  This allowed exposure of the axillary  flat and the dorsal root ganglion of the left L3 root.  Lying immediately  inferiorly to that was found a fragment of disc that was contiguous with the  disc space.  It was removed without difficulty, resulting in significant  decompression of the nerve root.  The nerve root was carefully  explored, and  no other fragments were found.  The wound was irrigated.  Then, using a high-  speed drill, bilateral laminotomy and foraminotomies were done to the top of  the ligamentum flavum at L4 bilaterally.  The ligamentum flavum was removed  in a retrograde fashion.  It was slightly tighter on the left than on the  right.  Both the L4 and L5 roots were demonstrated as they diverged from the  common neural tube.  Following complete decompression, the wound was  irrigated.  Depo-Medrol was used to cover the dorsal root ganglion as well  as both laminotomy defects.  The paraspinous muscles and fascia and  subcutaneous tissue was reapproximated with 0 Vicryl in an interrupted  fashion.  The subcuticular tissue was reapproximated with 3-0 Vicryl in an interrupted  fashion.  The skin was closed with 3-0 nylon in running-locked fashion.  Betadine and Telfa dressing was applied made occlusive with Op-Site, and the  patient returned to the recovery room in good condition.  MWR/MEDQ  D:  05/23/2004  T:  05/23/2004  Job:  [id number]

## 2010-07-14 NOTE — H&P (Signed)
Dominique Martin, Dominique Martin NO.:  [id number]   MEDICAL RECORD NO.:  [id number]          PATIENT TYPE:  INP   LOCATION:                               FACILITY:  [facility]   PHYSICIAN:  Payton Doughty, M.D.      DATE OF BIRTH:  09/15/1916   DATE OF ADMISSION:  05/23/2004  DATE OF DISCHARGE:                                HISTORY & PHYSICAL   A very nice 77 year old right-handed white lady, started February 13  beginning experiencing back pain.  Mostly pain down the left leg, anterior  thigh over the knee, but not so much laterally.  Has been bothering her for  a while and in the interim she developed shingles.  She saw another doctor  who recommended epidurals.  Went for epidurals.  Because of the shingles  would not get it done.  I visited with her March 3.  She had a foraminal  disk on the left side at L3-4.  She underwent foraminal steroids.  Did not  seem to do much and she is now admitted for diskectomy.  She also has the  stenosis at 4-5 and will have a decompression there.   PAST MEDICAL HISTORY:  Mitral valve prolapse.   MEDICATIONS:  1.  She is on Actonel 35 mg a week.  2.  Calcium.  3.  Multivitamins.  4.  Ultram on a p.r.n. basis.  5.  Lyrica 75 mg twice a day.   ALLERGIES:  PENICILLIN.   PAST SURGICAL HISTORY:  1.  Tonsillectomy in 1935.  2.  Appendectomy in 1942.  3.  Hemorrhoids in 1977.  4.  Cataracts in 2004 and 2005.  5.  She says laminectomy at 4-5 on the left side in 1996.   SOCIAL HISTORY:  Does not smoke.  Does not drink, is social drinker.  Is  retired.   FAMILY HISTORY:  Mom and daddy are both deceased.  Histories are not given.   REVIEW OF SYSTEMS:  Remarkable for glasses, cataracts, balance disturbance,  heart murmur, leg weakness, back pain, leg pain, arthritis.  She has a  benign familial tremor.   PHYSICAL EXAMINATION:  HEENT:  Normal limits.  NECK:  She has reasonable range of motion in her neck.  CHEST:  Clear.  CARDIAC:  Regular  rate and rhythm.  ABDOMEN:  Nontender.  No hepatosplenomegaly.  EXTREMITIES:  Without clubbing or cyanosis.  GENITOURINARY:  Deferred.  PERIPHERAL PULSES:  Good.  NEUROLOGIC:  She is awake, alert, oriented.  Cranial nerves are intact.  She  has mild __________ of the head.  Motor examination shows 5/5 strength  throughout the upper and lower extremities save left knee extensors which  are 4/5 on the left.  Reflexes are 2 at the right knee, absent at the left,  1 at the ankles bilaterally.  Straight leg raise is negative.  She has mild  irritability with internal rotation of the left hip.   MR demonstrates degenerative change at 4-5, facet arthropathy, and central  stenosis and thickening of the ligamentum flavum.  At 3-4 she has crowding  of  the left L3 neuroforamen of the foraminal disk.   CLINICAL IMPRESSION:  Foraminal disk L3-4, left L3 radiculopathy.  She has  failed epidural steroids.   PLAN:  Left foraminal diskectomy and bilateral laminotomy/foraminotomy at L4-  5.  The risks and benefits of this approach have been discussed with her and  she wishes to proceed.       ___________________________________________  Payton Doughty, M.D.    MWR/MEDQ  D:  05/23/2004  T:  05/23/2004  Job:  [id number]

## 2010-07-14 NOTE — Assessment & Plan Note (Signed)
[facility]                         GASTROENTEROLOGY OFFICE NOTE   TONISHA, SILVEY                     MRN:          [id number]  DATE:06/24/2006                            DOB:          07-09-1926    Ms. Dominique Martin is a delightful 77 year old white female who was recently  hospitalized with acute colitis, consistent with ischemic colitis.  Hospitalization occurred between April 15 to June 14, 2006.  She was  treated with antibiotics and bowel rest and recovered and went home on  Cipro and Flagyl.  She has only 2 more days of the medication left  Her  general condition has markedly improved.  She is back to regular diet.  Her bowel movements are regular.  She denies any abdominal pain.  She  has occasional cramps and a little bit of bloating, but there has been  no fever or bleeding.   MEDICATIONS:  1. Actonel weekly.  2. Zocor 40 mg p.o. daily.  3. Multivitamins.  4. Calcium supplements.   PAST HISTORY:  Is significant for heart rhythm problems, hyperlipidemia,  chronic headaches, hemorrhoid surgery, appendectomy and back surgery.   FAMILY HISTORY:  Negative for colon cancer.   SOCIAL HISTORY:  Married with 3 children.  She is a college degree and  worked in Research officer, political party.  She does not smoke and drinks alcohol  occasionally.   REVIEW OF SYSTEMS:  Is positive for severe fatigue.   PHYSICAL EXAMINATION:  Blood pressure 128/82, pulse 84 and weight 134  pounds.  She was alert, oriented and in no distress, appearing younger than her  stated age.  LUNGS:  Were clear to auscultation.  COR:  Quiet precordium, with irregular S1 and S2.  No murmur.  ABDOMEN:  Was soft, nontender with minimal discomfort in left lower  quadrant.  No rebound, no palpable mass,  RECTAL:  Exam not done.  EXTREMITIES:  No edema.   IMPRESSION:  67. A 77 year old white female with recent episode of ischemic colitis.      Etiology is not clear, possibly due to low flow  state, she is now      90% recovered.  2. Known moderately severe diverticulosis of the left colon,      documented on last colonoscopy in January 2005.  The next      colonoscopy would be in January 2010.  3. History of adenomas polyp of the colon on colonoscopy in [place] in 1990s.  4. Cardiac arrhythmia.  History of supraventricular tachycardia,      asymptomatic.   PLAN:  1. Resume fiber supplements, Metamucil 1 tablespoon daily.  2. The patient will be moving to [place] in September 2008 and      I asked her to have a recall colonoscopy in January 2010 when she      is in [place].  3. Gradual increase her diet to a high fiber diet.  I also told her      that she needs to drink a lot of liquids to make sure that she does      not  get dehydrated.     Hedwig Morton. Juanda Chance, MD  Electronically Signed    DMB/MedQ  DD: 06/24/2006  DT: 06/24/2006  Job #: [id number]   cc:   Kari Baars, M.D.

## 2010-07-14 NOTE — Discharge Summary (Signed)
[name], [name] NO.:  [id number]   MEDICAL RECORD NO.:  [id number]          PATIENT TYPE:  INP   LOCATION:  5003                         FACILITY:  [facility]   PHYSICIAN:  Burnard Bunting, M.D.    DATE OF BIRTH:  [birth year]   DATE OF ADMISSION:  07/11/2006  DATE OF DISCHARGE:  07/11/2006                               DISCHARGE SUMMARY   DISCHARGE DIAGNOSIS:  Left elbow fracture.   SECONDARY DIAGNOSES:  1. Vertigo.  2. Osteoarthritis.   OPERATIONS/PROCEDURES:  Left elbow fracture:  Open reduction internal  fixation performed Jul 09, 2006.   HOSPITAL COURSE:  Dominique Martin is a 77 year old female who sustained a  left elbow fracture Jul 09, 2006.  She underwent open reduction internal  fixation on Jul 09, 2006.  She  tolerated procedure well without any  complications.  Hand was well perfused, mobile and sensate on postop day  number one.  Patient had an otherwise unremarkable recovery.  She was  mobilized with physical therapy and was discharged home postop day  number two in good condition.   DISCHARGE MEDICATIONS:  Include:  1. Actonel one p.o. Aq. week.  2. Zocor 40 mg p.o. q.day.  3. Percocet one to two p.o. q.2-4 hours p.r.n. pain.   She will remain in a splint until her followup visit next week.      Burnard Bunting, M.D.  Electronically Signed     GSD/MEDQ  D:  09/26/2006  T:  09/26/2006  Job:  [id number]

## 2010-07-14 NOTE — Discharge Summary (Signed)
[place]  Patient:    Dominique Martin, Dominique Martin Visit Number: [id number] MRN: [id number]          Service Type: MED Location: [phone] Attending Physician:  Madison Hickman Dictated by:   Darius Bump, M.D. Admit Date:  05/13/2001 Discharge Date: 05/14/2001                             Discharge Summary  CONSULTATIONS:  Cardiology, Armanda Magic, M.D.  ADMISSION DIAGNOSES: 1. Episodic chest pain radiating to neck, rule out cardiac. 2. Four-month history of intermittent vertigo symptoms, rule out posterior    circulation event.  DISCHARGE DIAGNOSES: 1. Exertional chest discomfort radiating to jaw, ruled out for a myocardial    infarction, for outpatient cardiac evaluation including stress Cardiolite    test. 2. Episodic vertigo of unclear etiology, status post negative evaluation for    evidence of cerebrovascular disease.  DISCHARGE MEDICATIONS: 1. Aspirin 325 mg p.c. one p.o. q.d. 2. Actonel 35 mg weekly.  CONDITION ON DISCHARGE:  Stable.  HISTORY OF PRESENT ILLNESS:  Ms. Fruchter is a delightful 77 year old woman who was admitted to the office with an episode of chest discomfort which occurred after playing tennis.  She felt chest tightness that radiated to her right jaw and neck along with shortness of breath.  The discomfort was brief and lasted only about 30 seconds.  She also had a second concern of a month plus symptoms of feeling intermittent disequilibrium.  She had had an episode on the day of admission which was more intense than previous episodes, in which she almost felt confused.  She did not have any neurologic symptoms along with the episode.  She describes a months mild symptoms of mild disequilibrium which were not purely postural. She has not had associated tinnitus.  PAST MEDICAL HISTORY: 1. Osteoporosis. 2. Lumbar DJD. 3. History of colonic tubular adenoma. 4. Mild hyperlipidemia. 5. Mitral valve prolapse  with a murmur. 6. OA. 7. GERD. 8. Diverticulitis.  PHYSICAL EXAMINATION:  VITAL SIGNS:  Admission physical remarkable for normal vital signs.  HEENT:  Unremarkable.  NECK:  Unremarkable.  CHEST:  Unremarkable.  CARDIAC:  Normal S1, S2, no gallops.  Midsystolic click with a slight systolic murmur.  Question mild left carotid bruit.  ABDOMEN:  Unremarkable.  RECTAL:  Unremarkable.  NEUROLOGIC:  Alert and oriented.  Cranial nerves were intact.  DTRs were 2+ and symmetric.  Motor was 5.  Finger-to-nose, question mild past-pointing. Heel-to-shin normal.  Romberg question positive.  HOSPITAL COURSE:  #1 -  CHEST PAIN:  She was admitted to a telemetry bed. There was no ectopy noted.  She ruled out for an MI by serial enzymes and troponins.  She remained asymptomatic throughout hospitalization.  She was placed on an aspirin and cardiology was consulted, and it was felt that she was stable for discharge with prompt outpatient cardiovascular study.  #2 -  SENSATION OF DISEQUILIBRIUM:  This continued to a mild degree throughout the hospitalization.  She did undergo carotid Dopplers, which showed no evidence of significant plaque.  MRI and MRA showed mild atrophy and small-vessel disease, negative for acute infarction.  There were no noted abnormalities in the brainstem and cerebellum.  No abnormal intracranial enhancement.  MRA was negative for intracranial circulation abnormalities.  LABORATORY DATA:  White count was normal at admission at 5.5, platelet count 197.  Electrolytes were normal.  CK was 91 with CK-MB  of 1.1, troponin 0.02. Follow-up CKs were 72 and 66.  Cholesterol at the time of discharge was 195 with triglycerides of 86, HDL of 58, and LDL of 120.  TSH was normal.  CONDITION ON DISCHARGE:  Stable.  FOLLOW-UP:  She is to follow up for a stress Cardiolite, which is scheduled for a week following discharge.  In addition, she is to follow up with Julieanne Manson,  M.D., within one to two weeks after being discharged. Call if new symptoms develop. Dictated by:   Darius Bump, M.D. Attending Physician:  Madison Hickman DD:  05/23/01 TD:  05/24/01 Job: [id number]

## 2010-07-14 NOTE — Consult Note (Signed)
. [facility]  Patient:    Dominique Martin, KALINA Visit Number: [id number] MRN: [id number]          Service Type: MED Location: [phone] Attending Physician:  Madison Hickman Dictated by:   Armanda Magic, M.D. Proc. Date: 05/14/01 Admit Date:  05/13/2001 Discharge Date: 05/14/2001   CC:         Darius Bump, M.D.   Consultation Report  REFERRING PHYSICIAN:  Darius Bump, M.D.  CHIEF COMPLAINT:  Chest pain.  HISTORY OF PRESENT ILLNESS:  This is a 77 year old white female with a history of hyperlipidemia, remote tobacco use who was admitted with a one month history of disequilibrium and vertigo. Apparently yesterday while playing tennis she had an episode of disorientation, disequilibrium. She then had onset of chest tightness with radiation to the right jaw and neck and shortness of breath after going home. The chest pain only lasted about 30 seconds. She has had no previous other episodes of chest pain. EKG on admission was normal and cardiac enzymes x2 were normal.  PAST MEDICAL HISTORY: 1. Lumbar DJD. 2. Osteoporosis. 3. History of colonic tubular adenoma. 4. Mild hyperlipidemia. 5. Chronic hoarseness. 6. Osteoarthritis. 7. Mitral valve prolapse. 8. Gastroesophageal reflux disease. 9. Diverticulitis.  PAST SURGICAL HISTORY: 1. Tonsillectomy. 2. Appendectomy. 3. L4-L5 laminectomy. 4. Hysteroscopy with endometrial biopsy for bleeding. 5. Hemorrhoidectomy.  ALLERGIES:  PENICILLIN, she gets swelling. PREDNISONE, she gets psychosis.  CURRENT MEDICATIONS: 1. Citracal 1500 mg with vitamin D q.d. 2. Actonel 35 mg a week. 3. Nexium 40 mg a day.  FAMILY HISTORY:  No known history of MI. She thinks her dad might have had chest pain in the past.  SOCIAL HISTORY:  She quit tobacco use 20 years ago. She used to have a 15-pack-year history prior to that. She drinks two cocktails of alcohol nightly.  REVIEW OF SYSTEMS:   Otherwise, from what is stated in the HPI, is negative.  PHYSICAL EXAMINATION:  VITAL SIGNS:  Blood pressure 100/53, heart rate 60.  GENERAL:  Well-developed, well-nourished white female in no acute distress.  HEENT:  Benign.  NECK:  Supple without lymphadenopathy. No bruits.  LUNGS:  Clear to auscultation throughout.  HEART:  Regular rate and rhythm. No murmurs, rubs, or gallops. Normal S1, S2.  ABDOMEN:  Soft, nontender, nondistended with active bowel sounds. No hepatosplenomegaly.  EXTREMITIES:  No cyanosis, erythema, or edema. Good distal pulses.  LABORATORY AND ACCESSORY DATA:  Sodium 141, potassium 3.9, chloride 110, bicarb 25, BUN 14, creatinine 0.5, glucose 98. White cell count 5.5, hematocrit 40.9, hemoglobin 14, platelet count 197. CPKs are 91 and 72, MB is 1.1 and 0.7, troponin 0.02 and less than 0.01.  EKG shows normal sinus rhythm with no ischemia.  ASSESSMENT: 1. Chest pain with negative cardiac enzymes x2 and normal EKG. She has not    had any further chest pain. Cardiac risk factors include age, remote    tobacco history, and mild hyperlipidemia. 2. Vertigo of questionable etiology.  PLAN:  The patient does at some point need to be further restratified with stress Cardiolite. At present it would be difficult to walk on exercise treadmill with vertigo. Therefore, could do an adenosine Cardiolite although there are no slots open in the hospital for a test today. I think she would be fine to be discharged to home and get outpatient stress Cardiolite in our office after discharge. We will schedule this later this morning. Would recommend continuing her aspirin, discontinue nitroglycerin,  and check a fasting lipid panel. Dictated by:   Armanda Magic, M.D. Attending Physician:  Madison Hickman DD:  05/14/01 TD:  05/15/01 Job: [id number]

## 2010-07-14 NOTE — H&P (Signed)
[place]  Patient:    Dominique Martin, Dominique Martin Visit Number: [id number] MRN: [id number]          Service Type: MED Location: 5500 [phone] Attending Physician:  Madison Hickman Dictated by:   Julieanne Manson, M.D. Admit Date:  05/13/2001 Discharge Date: 05/14/2001                           History and Physical  DATE OF BIRTH:  06-19-1926  CHIEF COMPLAINT:  Disequilibrium and chest and neck discomfort.  HISTORY OF PRESENT ILLNESS:  This is a 77 year old female with a history of mild hypercholesterolemia and a distant smoking history who presented with a one-month history of intermittent disequilibrium, vertiginous symptoms, with an episode today that was much worse than she had had previously, while playing tennis.  Patient states that she began to feel the sense of disequilibrium and became disoriented to a point where she did not know if she was supposed to be serving or if she was on the right court.  She did recall where she was and who her playing partners were, and continued to play without what she feels anyone noting that she was having problems.  While driving home later, she continued to have this sensation of disequilibrium and disorientation and experienced a substernal chest tightness with radiation to her right jaw.  This was also associated with shortness of breath.  On arriving home she states she felt quite winded and fatigued after just climbing stairs.  Currently, her chest discomfort is resolved and her right jaw discomfort is resolved.  She is not short of breath but still feels very unsteady and shaky.  Describes having tremulousness previously with this sense of disequilibrium.  Disequilibrium does not occur with position change only. She states she has been lying in bed before and had it occur without turning her head.  PAST MEDICAL HISTORY: 1. Lumbar degenerative disk disease. 2. Osteoporosis. 3. History of colonic  tubular adenoma. 4. Mild hyperlipidemia; total cholesterol in the 230s. 5. History of chronic hoarseness. 6. Osteoarthritis. 7. Mitral valve prolapse with click and murmur. 8. GERD. 9. Diverticulosis.  PAST SURGICAL HISTORY: 1. Tonsillectomy. 2. Status post appendectomy. 3. Status post L4-L5 laminectomy. 4. History of a hysteroscopy and endometrial biopsy for post menopausal    bleeding, benign. 5. Hemorrhoidectomy.  CURRENT MEDICATIONS: 1. Citracal 1500 mg with D q.d. 2. Actonel 35 mg every week. 3. Nexium 40 mg q.d. p.r.n. 4. Citrucel as needed.  ALLERGIES:  PENICILLIN causing swelling of mouth and ears and itching. PREDNISONE caused steroid psychosis.  SOCIAL HISTORY:  Tobacco use:  Quit 22 years ago, had a 15 pack-year history prior to that.  Alcohol:  Two cocktails nightly.  REVIEW OF SYSTEMS:  No melena or hematochezia.  PHYSICAL EXAMINATION:  VITAL SIGNS:  Weight 130.  Pulse 80, temperature 97.8, blood pressure 120/60.  HEENT:  Pupils are equal, round and reactive to light.  Extraocular movements intact.  Disks are sharp.  Tympanic membranes are clear.  Throat is without injection.  NECK:  Supple without adenopathy.  No thyromegaly.  CHEST:  Clear.  CARDIOVASCULAR:  Regular rate and rhythm with normal S1 and S2.  No S3 or S4. She has a midsystolic click with a bit of a systolic murmur.  Question of a mild left carotid bruit.  Normodynamic peripheral pulses that are equal bilaterally.  No edema.  ABDOMEN:  Soft.  Bowel sounds present  throughout, nontender.  No organomegaly or masses appreciated.  RECTAL:  No mass.  Heme negative light brown stool.  NEUROLOGIC:  Alert and oriented x 3.  Cranial nerves II-XII grossly intact. Deep tendon reflexes 2+/4 throughout.  Motor is 5/5 throughout. Finger-nose-finger minimal past pointing intermittently.  She definitely has some tremulousness with this.  Heel-to-shin essentially normal.  Romberg was positive, worse  with closing her eyes.  Balance worse when she closes her eyes.  Gait, however, appeared steady.  Rapid alternating motions were normal.  ASSESSMENT AND PLAN: 1. Disequilibrium, vertigo - peripheral versus central versus a possible    cerebellar posterior circulation abnormality.  IV fluids.  Will arrange    for an MRI/MRA, probably in the morning.  Will also check carotid    Dopplers - this mainly for the bruit, though I do not feel this most    likely has anything to do with her disequilibrium.  Consider an echo if    she does have an abnormal MRI/MRA.  Would like to have her neurologic    evaluation completed before considering a stress test - see below. 2. Chest discomfort, rule out myocardial infarction.  IV nitroglycerin,    aspirin.  EKG currently shows some nonspecific changes in two inferior    leads but is otherwise essentially normal.  Will most likely consult    cardiology once #1 is clarified. 3. Gastroesophageal reflux disease.  Will continue with proton pump blocker    while in the hospital, switch to Protonix. Dictated by:   Julieanne Manson, M.D. Attending Physician:  Madison Hickman DD:  05/13/01 TD:  05/14/01 Job: [id number]

## 2010-07-14 NOTE — H&P (Signed)
NAMEHELENA, SARDO NO.:  [id number]   MEDICAL RECORD NO.:  [id number]          PATIENT TYPE:  INP   LOCATION:  4707                         FACILITY:  [facility]   PHYSICIAN:  Kari Baars, M.D.  DATE OF BIRTH:  1926-08-30   DATE OF ADMISSION:  06/11/2006  DATE OF DISCHARGE:                              HISTORY & PHYSICAL   CHIEF COMPLAINT:  Abdominal pain, blood in stools.   HISTORY OF PRESENT ILLNESS:  This is a 77 year old white female with a  history of diverticulitis (01/2004), remote history of SVT who presented  to the emergency department this morning with acute onset lower  abdominal pain associated with bright red blood per rectum.  The patient  states that she was in her usual state of health until this morning at  about 3:00 a.m. when she woke with crampy suprapubic pain associated  with a bowel movement that was streaked with blood.  She did have  tenesmus and pain that was worsened with bowel movement.  Since then she  has had several bowel movements that are mixed with blood with  persistent crampy abdominal pain.  She has had no nausea or vomiting.  No fevers, chills or sweats.  She had a similar episode in 01/2004  diagnosed as diverticulitis clinically.  She had a colonoscopy around  that same time that showed moderate diverticulosis by Dr. Juanda Chance.  Due  to her symptoms today she presented to the emergency department where  her vital signs are stable.  She had a CT scan that revealed colitis in  the transverse colon and descending colon with no abscess.   REVIEW OF SYSTEMS:  All systems were reviewed with the patient and are  negative except as in the HPI.  She did recently start Zocor for  persistent hyperlipidemia.  No chest pain, palpitations, rapid heart  rate, or shortness of breath.   PAST MEDICAL HISTORY:  1. Diverticulitis (01/2004).  Colonoscopy revealing diverticulosis by      Dr. Juanda Chance (2005).  2. Hyperlipidemia.  3. History  of SVT times one, resolved.  4. Essential tremor.  5. Osteoporosis.  6. History of shingles (04/2004).  7. BPPV.  8. Status post appendectomy.  9. Status post lumbar laminectomy x2.  10.Status post T&A.   MEDICATIONS:  1. Zocor 40 mg daily.  2. Actonel 35 mg daily.  3. Calcium/vitamin D twice a day.  4. Multivitamin.  5. Glucosamine.   ALLERGIES:  PENICILLIN causes itching.   SOCIAL HISTORY:  She is remarried and has three children and four  grandchildren.  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  Her husband was the [employer] tennis coach and [employer] prior to his retirement.  She was previously married  to a physician.  She quit smoking in 1982 and had a one pack per day for  15 years smoking history.  Social alcohol.   FAMILY HISTORY:  Father had lymphoma and died at 71.  Mother had lung  cancer.  Son with bipolar disorder.   PHYSICAL EXAM:  VITAL  SIGNS:  Temperature 97.9, pulse 71, respirations  18, blood pressure 129/63, oxygen saturation 95% on room air.  GENERAL:  She is in no acute distress but fatigued.  Not her usual spry  self.  HEENT: Oropharynx moist.  No scleral icterus.  NECK:  Supple without lymphadenopathy, JVD or carotid bruits.  HEART:  Regular rate and rhythm with a grade 2/6 systolic ejection  murmur.  LUNGS:  Clear to auscultation bilaterally.  ABDOMEN:  Soft, nondistended with normoactive bowel sounds.  She does  have lower suprapubic and left lower quadrant tenderness with no rebound  or guarding.  There is no appreciable mass.  EXTREMITIES:  No clubbing, cyanosis or edema.  NEUROLOGIC:  Alert and orient x4.   LABORATORY DATA:  Labs showed a CBC with a white count 10.3, hemoglobin  13.9, platelets 226.  B-met significant for sodium 138, potassium 4.1,  chloride 104, bicarb 22, BUN 23, creatinine 0.9, glucose 116.  Liver  function tests are normal.  Urinalysis negative.  INR 1.0.   STUDIES:  CT of  the abdomen and pelvis shows mucosal edema throughout  the distal transverse colon and descending colon most consistent with  colitis.  She does have scattered diverticuli.  Also noted is a complex  adnexal lesion measuring 38 x 33 mm which is probably complex ovarian  cyst.   ASSESSMENT//PLAN:  1. Abdominal pain secondary to colitis, possible diverticulitis - her      history is consistent with diverticulitis though the extent of      colonic involvement and hematochezia may suggest an alternative      diagnosis.  Differential diagnosis does include infectious colitis,      bowel/mesenteric ischemia, or less likely inflammatory bowel      disease.  Will treat empirically with Cipro and Flagyl.  Monitor      serial CBCs in the setting of her GI bleeding.  Will consult      gastroenterology (Dr. [facility]) to further evaluate and      consider colonoscopy either now or following therapy.  If this does      turn out to be diverticulitis we may need to consider surgical      options due to the recurrent nature.  2. Ovarian cyst - will plan for a pelvic ultrasound once #1 resolves.      She has no prior known history of ovarian cyst or mass.  3. History of SVT - I doubt that she has an embolic source of #1 but      will monitor her on telemetry to rule out paroxysmal tachycardia or      SVT.  4. Disposition - anticipate discharge in 1-2 days if she is stable and      no further GI evaluation necessary.      Kari Baars, M.D.  Electronically Signed    WS/MEDQ  D:  06/11/2006  T:  06/12/2006  Job:  [id number]   cc:   Hedwig Morton. Juanda Chance, MD

## 2010-07-14 NOTE — Discharge Summary (Signed)
NAMEALIZ, MERITT NO.:  [id number]   MEDICAL RECORD NO.:  [id number]          PATIENT TYPE:  INP   LOCATION:  4707                         FACILITY:  [facility]   PHYSICIAN:  Kari Baars, M.D.  DATE OF BIRTH:  19-Mar-1926   DATE OF ADMISSION:  06/11/2006  DATE OF DISCHARGE:  06/14/2006                               DISCHARGE SUMMARY   DISCHARGE DIAGNOSES:  1. Acute colitis, likely ischemic attacks.  2. Lower gastrointestinal bleed secondary to #1.  3. Right ovarian complex cyst (2.6 cm) with normal CA-125.  4. History of diverticulosis/diverticulitis.  5. History of supraventricular tachycardia.  6. Hyperlipidemia.  7. Essential tremor.  8. Osteoporosis.  9. History of shingles.  10.Benign paroxysmal positional vertigo.  11.Status post appendectomy.  12.Status post lumbar laminectomy x2.  13.Status post tonsillectomy and adenoidectomy.   DISCHARGE MEDICATIONS:  1. Cipro 500 mg b.i.d. x7 days.  2. Flagyl 500 mg q.8h. x7 days.  3. Zocor 40 mg daily.  4. Actonel 35 mg weekly.  5. Calcium/vitamin D 2-3 times per day.   HOSPITAL PROCEDURES:  1. CT of the abdomen and pelvis with contrast (June 11, 2006):      Mucosal edema throughout the distal transverse colon and descending      colon most consistent with colitis.  Scattered diverticula present.      Complex right adnexal lesion of 38 x 33 mm.  2. Pelvic/transvaginal ultrasound (June 11, 2006):  A 2.6 complex      cyst in the right ovary.  Cystic neoplasm not excluded.   HISTORY OF PRESENT ILLNESS:  For full details, please see dictated  history and physical.  Briefly, Dominique Martin is a very pleasant 77 year old  white female with a history of diverticulitis (December 2005), remote  history of supraventricular tachycardia who presented to the emergency  department on June 11, 2006, with acute onset lower abdominal pain  associated with bright red blood per rectum.  The patient was in her  usual state of  good health until the morning of presentation when she  awoke suddenly about 3:00 a.m. with crampy suprapubic of pain associated  with bowel movement that was streaked with blood.  Throughout the  remainder of the day she had tenesmus and crampy abdominal pain  associated with persistent bloody stools.  She denied fevers, chills or  sweats and had no nausea or vomiting.  She had a similar episode in  December 2005, that was diagnosed as diverticulitis clinically.  It does  not appear that a CT scan was performed at that time.  She did have a  colonoscopy for performed in 2005 that showed moderate diverticulosis.  Upon presentation to the emergency department, her vital signs were  stable with a temperature 97.7, blood pressure 131/62 with a heart rate  of 86.  CT scan showed colitis in the transverse colon and descending  colon with no apparent abscess.  The patient was admitted for further  management.   HOSPITAL COURSE:  The patient was admitted to a telemetry bed.  She was  placed on empiric Flagyl and Cipro to cover  infectious causes.  Serial  CBCs were obtained to rule out persistent GI bleed and remained  relatively stable with hemoglobin trend from 13.9-12.1 with hydration.  She did not have any additional bowel movements until the afternoon  prior to discharge and did not notice any blood in this.  The patient  was evaluated by GI who felt that the clinical history and CT scan  findings were most consistent with ischemic colitis, likely due to  vasospasm.  She is not on any antihypertensive medications and has  limited risk factors for peripheral vascular disease.  She denies  claudication symptoms.  She is on Zocor for hyperlipidemia which she  recently started.  She has a remote smoking history.  At this point,  they do not feel that further imaging is warranted given the isolated  episode.  She is now tolerating p.o.'s, full liquid diet with steady  decrease in her abdominal  cramping and pain.   Incidentally seen on her CT was also a right ovarian cyst.  This was  further evaluated with a pelvic ultrasound which showed a complex cyst  measuring 2.6 cm.  CA-125 was obtained and is normal (20).  Outpatient  gynecology referral will be made for further evaluation.  It is not felt  that this cyst was contributing to her acute symptoms.   At this point, the patient is tolerating p.o.'s with a decrease in her  abdominal pain.  She is stable for discharge home.   DISCHARGE DIET:  Full liquid diet.  Advance as tolerated to low residue  for several days.   DISCHARGE LABS:  CBC on the day of discharge was white count 4.8,  hemoglobin 12.3, platelets 190.  CA-125 20.  BMET normal with BUN and  creatinine of 4 and 0.8.  Lactic acid on admission 1.4.  White blood  count on admission 10.3.   DISCHARGE INSTRUCTIONS:  She was instructed to call if she has a fever  greater than 101.5, increased abdominal pain, or blood in stools.   HOSPITAL FOLLOW-UP:  She will follow up with Dr. Clelia Martin in 7-10 days.  She  should call Dr. Lina Martin if she has any GI related questions or  follow up as needed.   DISPOSITION:  To home with her husband.      Kari Baars, M.D.  Electronically Signed     WS/MEDQ  D:  06/14/2006  T:  06/14/2006  Job:  [id number]   cc:   Dominique Martin. Dominique Chance, MD

## 2010-07-21 ENCOUNTER — Ambulatory Visit
Admission: RE | Admit: 2010-07-21 | Discharge: 2010-07-21 | Disposition: A | Discharge status: Home / Self Care | Payer: Medicare Other | Source: Ambulatory Visit | Attending: Internal Medicine | Admitting: Internal Medicine

## 2010-07-21 DIAGNOSIS — Z1231 Encounter for screening mammogram for malignant neoplasm of breast: Secondary | ICD-10-CM

## 2010-12-12 LAB — CBC
Hemoglobin: 14.9
MCHC: 33.4
MCHC: 33.7
MCV: 95.7
MCV: 95.9
Platelets: 192
RBC: 4.68
WBC: 6.4

## 2011-02-27 DIAGNOSIS — F32A Depression, unspecified: Secondary | ICD-10-CM

## 2011-02-27 DIAGNOSIS — F0280 Dementia in other diseases classified elsewhere without behavioral disturbance: Secondary | ICD-10-CM

## 2011-02-27 HISTORY — DX: Depression, unspecified: F32.A

## 2011-02-27 HISTORY — DX: Dementia in other diseases classified elsewhere, unspecified severity, without behavioral disturbance, psychotic disturbance, mood disturbance, and anxiety: F02.80

## 2011-03-22 ENCOUNTER — Ambulatory Visit (HOSPITAL_COMMUNITY)
Admission: RE | Admit: 2011-03-22 | Discharge: 2011-03-22 | Disposition: A | Discharge status: Home / Self Care | Payer: Medicare Other | Source: Ambulatory Visit | Attending: Internal Medicine | Admitting: Internal Medicine

## 2011-03-22 ENCOUNTER — Other Ambulatory Visit (HOSPITAL_COMMUNITY): Payer: Self-pay | Admitting: Internal Medicine

## 2011-03-22 DIAGNOSIS — R42 Dizziness and giddiness: Secondary | ICD-10-CM | POA: Insufficient documentation

## 2011-03-22 DIAGNOSIS — W19XXXA Unspecified fall, initial encounter: Secondary | ICD-10-CM | POA: Insufficient documentation

## 2011-03-22 DIAGNOSIS — M852 Hyperostosis of skull: Secondary | ICD-10-CM | POA: Insufficient documentation

## 2011-03-22 DIAGNOSIS — R51 Headache: Secondary | ICD-10-CM | POA: Insufficient documentation

## 2011-03-22 DIAGNOSIS — I672 Cerebral atherosclerosis: Secondary | ICD-10-CM | POA: Insufficient documentation

## 2011-03-30 DIAGNOSIS — M4850XA Collapsed vertebra, not elsewhere classified, site unspecified, initial encounter for fracture: Secondary | ICD-10-CM

## 2011-03-30 DIAGNOSIS — R911 Solitary pulmonary nodule: Secondary | ICD-10-CM

## 2011-03-30 HISTORY — DX: Collapsed vertebra, not elsewhere classified, site unspecified, initial encounter for fracture: M48.50XA

## 2011-03-30 HISTORY — DX: Solitary pulmonary nodule: R91.1

## 2011-04-21 ENCOUNTER — Observation Stay (HOSPITAL_COMMUNITY)
Admission: EM | Admit: 2011-04-21 | Discharge: 2011-04-24 | Disposition: A | Discharge status: Skilled Nursing Facility | Payer: Medicare Other | Attending: Internal Medicine | Admitting: Internal Medicine

## 2011-04-21 ENCOUNTER — Emergency Department (HOSPITAL_COMMUNITY): Payer: Medicare Other

## 2011-04-21 ENCOUNTER — Encounter (HOSPITAL_COMMUNITY): Payer: Self-pay

## 2011-04-21 ENCOUNTER — Other Ambulatory Visit: Payer: Self-pay

## 2011-04-21 DIAGNOSIS — E559 Vitamin D deficiency, unspecified: Secondary | ICD-10-CM | POA: Insufficient documentation

## 2011-04-21 DIAGNOSIS — M545 Low back pain, unspecified: Principal | ICD-10-CM | POA: Insufficient documentation

## 2011-04-21 DIAGNOSIS — Z79899 Other long term (current) drug therapy: Secondary | ICD-10-CM | POA: Insufficient documentation

## 2011-04-21 DIAGNOSIS — E7800 Pure hypercholesterolemia, unspecified: Secondary | ICD-10-CM | POA: Insufficient documentation

## 2011-04-21 DIAGNOSIS — R269 Unspecified abnormalities of gait and mobility: Secondary | ICD-10-CM | POA: Insufficient documentation

## 2011-04-21 DIAGNOSIS — F0280 Dementia in other diseases classified elsewhere without behavioral disturbance: Secondary | ICD-10-CM | POA: Insufficient documentation

## 2011-04-21 DIAGNOSIS — R911 Solitary pulmonary nodule: Secondary | ICD-10-CM | POA: Insufficient documentation

## 2011-04-21 DIAGNOSIS — I10 Essential (primary) hypertension: Secondary | ICD-10-CM | POA: Insufficient documentation

## 2011-04-21 DIAGNOSIS — G309 Alzheimer's disease, unspecified: Secondary | ICD-10-CM | POA: Insufficient documentation

## 2011-04-21 DIAGNOSIS — Y9210 Unspecified residential institution as the place of occurrence of the external cause: Secondary | ICD-10-CM | POA: Insufficient documentation

## 2011-04-21 DIAGNOSIS — R7302 Impaired glucose tolerance (oral): Secondary | ICD-10-CM

## 2011-04-21 DIAGNOSIS — G43909 Migraine, unspecified, not intractable, without status migrainosus: Secondary | ICD-10-CM | POA: Insufficient documentation

## 2011-04-21 DIAGNOSIS — S32009A Unspecified fracture of unspecified lumbar vertebra, initial encounter for closed fracture: Secondary | ICD-10-CM | POA: Insufficient documentation

## 2011-04-21 DIAGNOSIS — W19XXXA Unspecified fall, initial encounter: Secondary | ICD-10-CM | POA: Insufficient documentation

## 2011-04-21 DIAGNOSIS — J90 Pleural effusion, not elsewhere classified: Secondary | ICD-10-CM | POA: Insufficient documentation

## 2011-04-21 DIAGNOSIS — S32000A Wedge compression fracture of unspecified lumbar vertebra, initial encounter for closed fracture: Secondary | ICD-10-CM | POA: Diagnosis present

## 2011-04-21 DIAGNOSIS — R0902 Hypoxemia: Secondary | ICD-10-CM | POA: Insufficient documentation

## 2011-04-21 HISTORY — DX: Pure hypercholesterolemia, unspecified: E78.00

## 2011-04-21 HISTORY — DX: Essential (primary) hypertension: I10

## 2011-04-21 HISTORY — DX: Dementia in other diseases classified elsewhere without behavioral disturbance: F02.80

## 2011-04-21 HISTORY — DX: Alzheimer's disease, unspecified: G30.9

## 2011-04-21 LAB — DIFFERENTIAL
Basophils Absolute: 0 10*3/uL (ref 0.0–0.1)
Lymphocytes Relative: 9 % — ABNORMAL LOW (ref 12–46)
Neutro Abs: 8.6 10*3/uL — ABNORMAL HIGH (ref 1.7–7.7)

## 2011-04-21 LAB — COMPREHENSIVE METABOLIC PANEL
ALT: 27 U/L (ref 0–35)
AST: 24 U/L (ref 0–37)
CO2: 26 mEq/L (ref 19–32)
Calcium: 8.8 mg/dL (ref 8.4–10.5)
Chloride: 100 mEq/L (ref 96–112)
GFR calc non Af Amer: 78 mL/min — ABNORMAL LOW (ref >90)
Sodium: 135 mEq/L (ref 135–145)
Total Bilirubin: 0.5 mg/dL (ref 0.3–1.2)

## 2011-04-21 LAB — CBC
Platelets: 176 10*3/uL (ref 150–400)
RDW: 12.9 % (ref 11.5–15.5)
WBC: 10.1 10*3/uL (ref 4.0–10.5)

## 2011-04-21 LAB — URINALYSIS, ROUTINE W REFLEX MICROSCOPIC
Bilirubin Urine: NEGATIVE
Hgb urine dipstick: NEGATIVE
Specific Gravity, Urine: 1.016 (ref 1.005–1.030)
Urobilinogen, UA: 0.2 mg/dL (ref 0.0–1.0)

## 2011-04-21 LAB — APTT: aPTT: 31 seconds (ref 24–37)

## 2011-04-21 MED ORDER — SODIUM CHLORIDE 0.9 % IJ SOLN
3.0000 mL | INTRAMUSCULAR | Status: DC | PRN
  Administered 2011-04-21: 3 mL via INTRAVENOUS

## 2011-04-21 MED ORDER — OXYCODONE HCL 5 MG PO TABS
5.0000 mg | ORAL_TABLET | ORAL | Status: DC | PRN
  Administered 2011-04-22 – 2011-04-23 (×3): 5 mg via ORAL
  Filled 2011-04-21 (×3): qty 1

## 2011-04-21 MED ORDER — VITAMIN D3 25 MCG (1000 UNIT) PO TABS
1000.0000 [IU] | ORAL_TABLET | Freq: Every day | ORAL | Status: DC
  Administered 2011-04-21 – 2011-04-24 (×4): 1000 [IU] via ORAL
  Filled 2011-04-21 (×5): qty 1

## 2011-04-21 MED ORDER — ACETAMINOPHEN 500 MG PO TABS: 500.0000 mg | ORAL_TABLET | Freq: Four times a day (QID) | ORAL | Status: DC | PRN

## 2011-04-21 MED ORDER — ONDANSETRON HCL 4 MG/2ML IJ SOLN
INTRAMUSCULAR | Status: AC
  Administered 2011-04-21: 4 mg
  Filled 2011-04-21: qty 2

## 2011-04-21 MED ORDER — CALCIUM CARBONATE-VITAMIN D 500-200 MG-UNIT PO TABS
1.0000 | ORAL_TABLET | Freq: Two times a day (BID) | ORAL | Status: DC
  Administered 2011-04-21 – 2011-04-24 (×6): 1 via ORAL
  Filled 2011-04-21 (×10): qty 1

## 2011-04-21 MED ORDER — SIMVASTATIN 40 MG PO TABS
40.0000 mg | ORAL_TABLET | Freq: Every evening | ORAL | Status: DC
  Administered 2011-04-21 – 2011-04-23 (×3): 40 mg via ORAL
  Filled 2011-04-21 (×5): qty 1

## 2011-04-21 MED ORDER — MEMANTINE HCL 10 MG PO TABS
10.0000 mg | ORAL_TABLET | Freq: Two times a day (BID) | ORAL | Status: DC
  Administered 2011-04-21 – 2011-04-24 (×6): 10 mg via ORAL
  Filled 2011-04-21 (×9): qty 1

## 2011-04-21 MED ORDER — FENTANYL CITRATE 0.05 MG/ML IJ SOLN
100.0000 ug | Freq: Once | INTRAMUSCULAR | Status: DC
  Administered 2011-04-21: 100 ug via INTRAVENOUS
  Filled 2011-04-21 (×2): qty 2

## 2011-04-21 MED ORDER — ENOXAPARIN SODIUM 40 MG/0.4ML ~~LOC~~ SOLN
40.0000 mg | Freq: Every day | SUBCUTANEOUS | Status: DC
  Administered 2011-04-21 – 2011-04-23 (×3): 40 mg via SUBCUTANEOUS
  Filled 2011-04-21 (×5): qty 0.4

## 2011-04-21 MED ORDER — DOCUSATE SODIUM 100 MG PO CAPS
100.0000 mg | ORAL_CAPSULE | Freq: Two times a day (BID) | ORAL | Status: DC
  Administered 2011-04-21 – 2011-04-24 (×6): 100 mg via ORAL
  Filled 2011-04-21 (×9): qty 1

## 2011-04-21 MED ORDER — TRAZODONE 25 MG HALF TABLET
25.0000 mg | ORAL_TABLET | Freq: Every evening | ORAL | Status: DC | PRN
  Filled 2011-04-21: qty 1

## 2011-04-21 MED ORDER — HYDROMORPHONE HCL PF 1 MG/ML IJ SOLN
0.5000 mg | INTRAMUSCULAR | Status: DC | PRN
  Administered 2011-04-22: 0.5 mg via INTRAVENOUS
  Filled 2011-04-21: qty 1

## 2011-04-21 MED ORDER — FENTANYL 25 MCG/HR TD PT72
25.0000 ug | MEDICATED_PATCH | TRANSDERMAL | Status: DC
  Administered 2011-04-21: 25 ug via TRANSDERMAL
  Filled 2011-04-21: qty 1

## 2011-04-21 MED ORDER — ESCITALOPRAM OXALATE 10 MG PO TABS
10.0000 mg | ORAL_TABLET | Freq: Every day | ORAL | Status: DC
  Administered 2011-04-21 – 2011-04-24 (×4): 10 mg via ORAL
  Filled 2011-04-21 (×5): qty 1

## 2011-04-21 MED ORDER — SODIUM CHLORIDE 0.9 % IV SOLN
INTRAVENOUS | Status: DC
  Administered 2011-04-21: 20:00:00 via INTRAVENOUS
  Administered 2011-04-21: 125 mL via INTRAVENOUS

## 2011-04-21 MED ORDER — BISACODYL 5 MG PO TBEC: 5.0000 mg | DELAYED_RELEASE_TABLET | Freq: Every day | ORAL | Status: DC | PRN

## 2011-04-21 MED ORDER — PANTOPRAZOLE SODIUM 40 MG PO TBEC
40.0000 mg | DELAYED_RELEASE_TABLET | Freq: Every day | ORAL | Status: DC
  Administered 2011-04-21 – 2011-04-24 (×4): 40 mg via ORAL
  Filled 2011-04-21 (×5): qty 1

## 2011-04-21 MED ORDER — HYDROMORPHONE HCL PF 2 MG/ML IJ SOLN
2.0000 mg | Freq: Once | INTRAMUSCULAR | Status: AC
  Administered 2011-04-21: 2 mg via INTRAVENOUS
  Filled 2011-04-21: qty 1

## 2011-04-21 MED ORDER — SODIUM CHLORIDE 0.9 % IV SOLN: 250.0000 mL | INTRAVENOUS | Status: DC | PRN

## 2011-04-21 MED ORDER — SODIUM CHLORIDE 0.9 % IV SOLN
INTRAVENOUS | Status: DC
  Administered 2011-04-21: 10 mL/h via INTRAVENOUS

## 2011-04-21 NOTE — ED Notes (Signed)
ZOX:[id number]<AV> Expected date:<BR> Expected time:12:10 PM<BR> Means of arrival:<BR> Comments:<BR> M15 - 84yoF Fall, hip, back pain/LSB, fentanyl

## 2011-04-21 NOTE — Consult Note (Signed)
Reason for Consult:L1 fracture Referring Physician: EDP  Dominique Martin is an 78 y.o. female.   HPI:  Pt presents after a fall today. Pt is demented and cannot give accurate history and no family available at present. Pt recalls fall, but not how she landed or why she fell. EDP states she falls often. Pt denied leg pain, or N/T/W. C/o low back pain that is moderately severe and stable. CT showed L1 fx and consultation was requested.  Past Medical History  Diagnosis Date  . Vitamin d deficiency   . Hypercholesterolemia   . Alzheimer disease   . Hypertension   . Migraine     No past surgical history on file.  Allergies  Allergen Reactions  . Aricept (Donepezil Hydrochloride)   . Penicillins     History  Substance Use Topics  . Smoking status: Not on file  . Smokeless tobacco: Not on file  . Alcohol Use: No    No family history on file.   Review of Systems  Positive ROS: neg  All other systems have been reviewed and were otherwise negative with the exception of those mentioned in the HPI and as above.  Objective: Vital signs in last 24 hours: Temp:  [97.6 F (36.4 C)] 97.6 F (36.4 C) (02/23 1333) Pulse Rate:  [79] 79  (02/23 1333) Resp:  [20] 20  (02/23 1333) BP: (138)/(78) 138/78 mmHg (02/23 1333) SpO2:  [94 %] 94 % (02/23 1333)  General Appearance: Alert, cooperative, in obvious discomfort Head: Normocephalic, without obvious abnormality, atraumatic Eyes: PERRL, conjunctiva/corneas clear, EOM's intact      Ears: Normal TM's and external ear canals, both ears Throat: benign Neck: Supple, NT Back: Symmetric, tender Heart: Regular rate and rhythm, S1 and S2 normal, no murmur, rub or gallop Abdomen: Soft, non-tender, bowel sounds active all four quadrants, no masses, no organomegaly Extremities: Extremities normal, atraumatic, no cyanosis or edema Pulses: 2+ and symmetric all extremities Skin: Skin color, texture, turgor normal, no rashes or  lesions  NEUROLOGIC:   Mental status: A&O x4, no aphasia, good attention span, poor memory Motor Exam - grossly normal, normal tone and bulk Sensory Exam - grossly normal Reflexes: symmetric, no pathologic reflexes, No Hoffman's, No clonus Coordination - not tested Gait - unable to test Balance: unable to test Cranial Nerves: I: smell Not tested  II: visual acuity  OS: ok    OD: ok  II: visual fields Full to confrontation  II: pupils Equal, round, reactive to light  III,VII: ptosis None  III,IV,VI: extraocular muscles  Full ROM  V: mastication Normal  V: facial light touch sensation  Normal  V,VII: corneal reflex  Present  VII: facial muscle function - upper  Normal  VII: facial muscle function - lower Normal  VIII: hearing Not tested  IX: soft palate elevation  Normal  IX,X: gag reflex Present  XI: trapezius strength  5/5  XI: sternocleidomastoid strength 5/5  XI: neck flexion strength  5/5  XII: tongue strength  Normal    Data Review Lab Results  Component Value Date   WBC 10.1 04/21/2011   HGB 13.5 04/21/2011   HCT 40.6 04/21/2011   MCV 92.5 04/21/2011   PLT 176 04/21/2011   Lab Results  Component Value Date   NA 135 04/21/2011   K 4.4 04/21/2011   CL 100 04/21/2011   CO2 26 04/21/2011   BUN 16 04/21/2011   CREATININE 0.71 04/21/2011   GLUCOSE 120* 04/21/2011   Lab Results  Component Value Date   INR 1.05 04/21/2011    Radiology: No results found. X-Ray: L1 fracture with some loss of height, no kyphosis  Assessment/Plan: L1 fracture after a fall. Dementia. Question multiple falls. Admit to medicine for coordination of care and to rule out causes of falls like syncope. Will need LSO brace, then can be mobilized in brace. CT L spine ordered by EDP. This will help determine stability and treatment plan.   Sabin Gibeault S 04/21/2011 3:18 PM

## 2011-04-21 NOTE — ED Notes (Signed)
Ortho Tech called for biotech brace.

## 2011-04-21 NOTE — ED Notes (Signed)
EMS given a total of of Fentanyl in increment of 

## 2011-04-21 NOTE — Progress Notes (Signed)
Patient ID: Dominique Martin, female   DOB: [birth year], 78 y.o.   MRN: [id number] CT reviewed. Shows a 2 column L1 fx with min retropulsion but fairly significant loss of central height. Given pt's age and dementia I would like to avoid surgery if possible, and I feel she deserves a trial in the brace. May mobilize in her brace and get upright xrays when she can tolerate them. Family wishes her to remain at [facility] hospital where we typically don't practice neurosurgery, but it is reasonable to allow the hospitalists to guide her care at [facility]. They can call me with any questions and I can see her as needed until discharge, at which time she should follow up with me.

## 2011-04-21 NOTE — H&P (Signed)
Dominique Martin is an 78 y.o. female.   Chief Complaint: Back pain  HPI:   78 year-old patient who is a resident of [facility] and requires an assisted living level of care. She has a long history of gait abnormality and has had frequent falls.  She has dementia and usually is ambulatory with the assistance of a walker. She does have a history of PSVT but no prior history of syncope.  The patient presented to the emergency department today after an unwitnessed fall resulting  in back pain. Radiographs confirm a L1 compression fracture. The patient was evaluated by neurosurgery who felt that the patient was to be best managed by a LSO brace and medical admission for pain control and physical therapy.  Past Medical History  Diagnosis Date  . Vitamin d deficiency   . Hypercholesterolemia   . Alzheimer disease   . Hypertension   . Migraine     Past Surgical History  Procedure Date  . Bilateral salpingoophorectomy   . Carpal tunnel release     History reviewed. No pertinent family history. Social History:  reports that she quit smoking about 31 years ago. She does not have any smokeless tobacco history on file. She reports that she does not drink alcohol or use illicit drugs.  Allergies:  Allergies  Allergen Reactions  . Aricept (Donepezil Hydrochloride)   . Penicillins     Medications Prior to Admission  Medication Dose Route Frequency Provider Last Rate Last Dose  . 0.9 %  sodium chloride infusion   Intravenous Continuous Carleene Cooper III, MD 125 mL/hr at 04/21/11 1301 125 mL at 04/21/11 1301  . fentaNYL (SUBLIMAZE) injection 100 mcg  100 mcg Intravenous Once Carleene Cooper III, MD      . HYDROmorphone (DILAUDID) injection 2 mg  2 mg Intravenous Once Carleene Cooper III, MD   2 mg at 04/21/11 1531   No current outpatient prescriptions on file as of 04/21/2011.    Results for orders placed during the hospital encounter of 04/21/11 (from the past 48 hour(s))  CBC     Status: Normal   Collection Time   04/21/11  1:00 PM      Component Value Range Comment   WBC 10.1  4.0 - 10.5 (K/uL)    RBC 4.39  3.87 - 5.11 (MIL/uL)    Hemoglobin 13.5  12.0 - 15.0 (g/dL)    HCT 16.1  09.6 - 04.5 (%)    MCV 92.5  78.0 - 100.0 (fL)    MCH 30.8  26.0 - 34.0 (pg)    MCHC 33.3  30.0 - 36.0 (g/dL)    RDW 40.9  81.1 - 91.4 (%)    Platelets 176  150 - 400 (K/uL)   DIFFERENTIAL     Status: Abnormal   Collection Time   04/21/11  1:00 PM      Component Value Range Comment   Neutrophils Relative 85 (*) 43 - 77 (%)    Neutro Abs 8.6 (*) 1.7 - 7.7 (K/uL)    Lymphocytes Relative 9 (*) 12 - 46 (%)    Lymphs Abs 0.9  0.7 - 4.0 (K/uL)    Monocytes Relative 6  3 - 12 (%)    Monocytes Absolute 0.6  0.1 - 1.0 (K/uL)    Eosinophils Relative 0  0 - 5 (%)    Eosinophils Absolute 0.0  0.0 - 0.7 (K/uL)    Basophils Relative 0  0 - 1 (%)    Basophils Absolute  0.0  0.0 - 0.1 (K/uL)   COMPREHENSIVE METABOLIC PANEL     Status: Abnormal   Collection Time   04/21/11  1:00 PM      Component Value Range Comment   Sodium 135  135 - 145 (mEq/L)    Potassium 4.4  3.5 - 5.1 (mEq/L)    Chloride 100  96 - 112 (mEq/L)    CO2 26  19 - 32 (mEq/L)    Glucose, Bld 120 (*) 70 - 99 (mg/dL)    BUN 16  6 - 23 (mg/dL)    Creatinine, Ser 1.61  0.50 - 1.10 (mg/dL)    Calcium 8.8  8.4 - 10.5 (mg/dL)    Total Protein 6.5  6.0 - 8.3 (g/dL)    Albumin 3.9  3.5 - 5.2 (g/dL)    AST 24  0 - 37 (U/L)    ALT 27  0 - 35 (U/L)    Alkaline Phosphatase 53  39 - 117 (U/L)    Total Bilirubin 0.5  0.3 - 1.2 (mg/dL)    GFR calc non Af Amer 78 (*) >90 (mL/min)    GFR calc Af Amer >90  >90 (mL/min)   PROTIME-INR     Status: Normal   Collection Time   04/21/11  1:00 PM      Component Value Range Comment   Prothrombin Time 13.9  11.6 - 15.2 (seconds)    INR 1.05  0.00 - 1.49    APTT     Status: Normal   Collection Time   04/21/11  1:00 PM      Component Value Range Comment   aPTT 31  24 - 37 (seconds)   URINALYSIS, ROUTINE W REFLEX  MICROSCOPIC     Status: Normal   Collection Time   04/21/11  2:21 PM      Component Value Range Comment   Color, Urine YELLOW  YELLOW     APPearance CLEAR  CLEAR     Specific Gravity, Urine 1.016  1.005 - 1.030     pH 7.0  5.0 - 8.0     Glucose, UA NEGATIVE  NEGATIVE (mg/dL)    Hgb urine dipstick NEGATIVE  NEGATIVE     Bilirubin Urine NEGATIVE  NEGATIVE     Ketones, ur NEGATIVE  NEGATIVE (mg/dL)    Protein, ur NEGATIVE  NEGATIVE (mg/dL)    Urobilinogen, UA 0.2  0.0 - 1.0 (mg/dL)    Nitrite NEGATIVE  NEGATIVE     Leukocytes, UA NEGATIVE  NEGATIVE  MICROSCOPIC NOT DONE ON URINES WITH NEGATIVE PROTEIN, BLOOD, LEUKOCYTES, NITRITE, OR GLUCOSE <1000 mg/dL.   Dg Thoracic Spine 2 View  04/21/2011  *RADIOLOGY REPORT*  Clinical Data: Back pain  THORACIC SPINE - 2 VIEW  Comparison: 05/19/2004  Findings: Slight dextroscoliosis of the lower thoracic spine.  L1 compression deformity is noted.  No compression fracture is visualized in the thoracic spine.  Osteopenia.  Limited images of the cervical spine demonstrates significant narrowing at C5-6 with posterior osteophytic ridging.  IMPRESSION: No acute thoracic injury.  L1 compression deformity is noted.  Original Report Authenticated By: Donavan Burnet, M.D.   Dg Lumbar Spine Complete  04/21/2011  *RADIOLOGY REPORT*  Clinical Data: Fall  LUMBAR SPINE - COMPLETE 4+ VIEW  Comparison: None.  Findings: There is an acute appearing L1 compression fracture. There is suspected to be at least 3 mm retropulsion of the superior endplate.  There is proximally 50% loss of height anteriorly. Osteopenia.  Anatomic alignment.  Mild narrowing of the L3-4 and L4- 5 discs.  Facet arthropathy in the lower lumbar spine.  Vascular calcifications are prominent.  IMPRESSION: L1 compression fracture with acute fracture lines and suspected retropulsion of the superior endplate.  MRI or CT may be helpful to further characterize.  Original Report Authenticated By: Donavan Burnet, M.D.     Dg Pelvis 1-2 Views  04/21/2011  *RADIOLOGY REPORT*  Clinical Data: Low back pain.  Fall.  PELVIS - 1-2 VIEW  Comparison: None.  Findings: No acute fracture and no dislocation.  Unremarkable soft tissues.  IMPRESSION: No acute bony pathology.  Original Report Authenticated By: Donavan Burnet, M.D.   Ct Head Wo Contrast  04/21/2011  *RADIOLOGY REPORT*  Clinical Data:  78 year old female with head and neck injury, headache and neck pain following fall.  CT HEAD WITHOUT CONTRAST CT CERVICAL SPINE WITHOUT CONTRAST  Technique:  Multidetector CT imaging of the head and cervical spine was performed following the standard protocol without intravenous contrast.  Multiplanar CT image reconstructions of the cervical spine were also generated.  Comparison:  03/22/2011 head CT  CT HEAD  Findings: Mild chronic small vessel white matter ischemic changes are again identified. Equivocal low attenuation within the right to mid brain and pons is noted - suspect artifact but a subacute to remote infarct is not excluded. No acute intracranial abnormalities are identified, including mass lesion or mass effect, hydrocephalus, extra-axial fluid collection, midline shift, hemorrhage, or acute infarction.  The visualized bony calvarium is unremarkable.  IMPRESSION: No evidence of acute intracranial abnormality.  Possible subacute to remote right mid brain - pontine infarct, although this finding could represent artifact.  Mild chronic small vessel white matter ischemic changes.  CT CERVICAL SPINE  Findings: Normal alignment is noted. There is no evidence of fracture, subluxation or prevertebral soft tissue swelling. Moderate degenerative disc disease and spondylosis at C5-C6 noted. Mild to moderate multilevel facet arthropathy throughout the cervical spine noted. No suspicious focal bony lesions are identified.  The soft tissue structures are unremarkable.  IMPRESSION: No static evidence of acute injury to the cervical spine.   Moderate degenerative disc disease at C5-C6 and mild to moderate multilevel facet arthropathy.  Original Report Authenticated By: Rosendo Gros, M.D.   Ct Cervical Spine Wo Contrast  04/21/2011  *RADIOLOGY REPORT*  Clinical Data:  78 year old female with head and neck injury, headache and neck pain following fall.  CT HEAD WITHOUT CONTRAST CT CERVICAL SPINE WITHOUT CONTRAST  Technique:  Multidetector CT imaging of the head and cervical spine was performed following the standard protocol without intravenous contrast.  Multiplanar CT image reconstructions of the cervical spine were also generated.  Comparison:  03/22/2011 head CT  CT HEAD  Findings: Mild chronic small vessel white matter ischemic changes are again identified. Equivocal low attenuation within the right to mid brain and pons is noted - suspect artifact but a subacute to remote infarct is not excluded. No acute intracranial abnormalities are identified, including mass lesion or mass effect, hydrocephalus, extra-axial fluid collection, midline shift, hemorrhage, or acute infarction.  The visualized bony calvarium is unremarkable.  IMPRESSION: No evidence of acute intracranial abnormality.  Possible subacute to remote right mid brain - pontine infarct, although this finding could represent artifact.  Mild chronic small vessel white matter ischemic changes.  CT CERVICAL SPINE  Findings: Normal alignment is noted. There is no evidence of fracture, subluxation or prevertebral soft tissue swelling. Moderate degenerative disc disease and spondylosis at C5-C6 noted. Mild  to moderate multilevel facet arthropathy throughout the cervical spine noted. No suspicious focal bony lesions are identified.  The soft tissue structures are unremarkable.  IMPRESSION: No static evidence of acute injury to the cervical spine.  Moderate degenerative disc disease at C5-C6 and mild to moderate multilevel facet arthropathy.  Original Report Authenticated By: Rosendo Gros, M.D.     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  **END ADDENDUM** SIGNED BY: Arnell Sieving, M.D.    04/21/2011  *RADIOLOGY REPORT*  Clinical Data: L1 compression fracture after a fall.  CT LUMBAR SPINE WITHOUT CONTRAST 04/21/2011:  Technique:  Multidetector CT imaging of the lumbar spine was performed without intravenous contrast administration. Multiplanar CT image reconstructions were also generated.  Comparison: Lumbar spine x-rays performed earlier same date.  Findings: Approximate 60% compression fracture of the L1 vertebral body, secondary to osteoporosis.  Slight retropulsion of the upper endplate fragment, without significant spinal stenosis.  No other fractures involving the lumbar spine.  No evidence of significant disc protrusion or extrusion at any lumbar level; chronic mild protrusion at L5 with calcification in the posterior annular fibers.  Neural foramina widely patent at each level, including L1- 2.  Mild facet degenerative changes at L4-5 and moderate to severe facet degenerative changes at L5-S1.  IMPRESSION:  1.  Approximate 60% osteoporotic compression fracture of the L1 vertebral body.  Slight retropulsion of the upper endplate fragment without evidence of spinal stenosis. By definition, the patient has established osteoporosis. 2.  No other fractures involving the lumbar spine. 3.  Facet degenerative changes at L5-S1 greater than L4-5 without associated foraminal stenoses.  Original Report Authenticated By: Arnell Sieving, M.D.    Review of Systems  Constitutional: Negative for fever, chills and weight loss.  HENT: Negative for hearing loss, congestion and neck pain.   Eyes: Negative.   Respiratory: Negative for cough and shortness of breath.   Cardiovascular:  Negative.  Orthopnea: history migraine headaches.  Gastrointestinal: Negative.        Patient has a history of ischemic colitis as well as diverticular disease. History of colonic polyps  Genitourinary: Negative.  Negative for dysuria, urgency and frequency.  Musculoskeletal: Positive for back pain and falls.  Skin: Negative.   Neurological: Positive for tremors. Negative for dizziness, focal weakness, seizures, loss of consciousness, weakness and headaches.  Endo/Heme/Allergies: Negative for environmental allergies and polydipsia. Does not bruise/bleed easily.  Psychiatric/Behavioral: Positive for memory loss. Negative for depression, hallucinations and substance abuse.    Blood pressure 121/68, pulse 70, temperature 98 F (36.7 C), temperature source Oral, resp. rate 18, SpO2 91.00%. Physical Exam  Constitutional: She appears well-developed and well-nourished. No distress.  HENT:  Head: Normocephalic and atraumatic.  Right Ear: External ear normal.  Left Ear: External ear normal.  Mouth/Throat: Oropharynx is clear and moist. No oropharyngeal exudate.       Dental hygiene excellent  Eyes: Conjunctivae and EOM are normal. Pupils are equal, round, and reactive to light. No scleral icterus.  Neck: Normal range of motion. Neck supple. No JVD present. No thyromegaly present.  Cardiovascular: Normal rate, regular rhythm, normal heart sounds and intact distal pulses.        Pedal pulses were full  Respiratory: Effort normal and breath sounds normal. No respiratory distress. She has no wheezes. She  has no rales. She exhibits no tenderness.  GI: Soft. Bowel sounds are normal. She exhibits no distension. There is no tenderness. There is no rebound and no guarding.  Musculoskeletal: Normal range of motion. She exhibits no edema and no tenderness.  Lymphadenopathy:    She has no cervical adenopathy.  Neurological: She has normal reflexes. She displays normal reflexes. No cranial nerve deficit. She  exhibits normal muscle tone.       Moderate cognitive impairment  Tremor  Skin: Skin is warm and dry. No rash noted.  Psychiatric: She has a normal mood and affect. Her behavior is normal.     Assessment/Plan  1. acute traumatic L1 compression fracture. Will admit to the medical service for pain control; the patient will be treated with a LSO brace; PT and OT will be consult and possibly CM; anticipate early discharge back to [facility] where patient can enjoy their rehabilitation facility when pain control adequate; will treat with DVT prophylaxis and analgesics. 2. dementia of the Alzheimer's type. Continue Namenda 3. unsteady gait. Doubt this represents syncope we'll monitor her in a telemetry setting for 24 hours. Patient does have a history of PSVT      Rogelia Boga 04/21/2011, 5:54 PM

## 2011-04-21 NOTE — ED Notes (Addendum)
Per ems, pt fell on left side on tile floor, no LOC, denied taking blood thinner, pain at left hip and left lower lumbar. Pt feeling nausea. Present to ED on head block and spinal board

## 2011-04-21 NOTE — ED Provider Notes (Signed)
History     CSN: [id number]  Arrival date & time 04/21/11  1215   First MD Initiated Contact with Patient 04/21/11 1237      No chief complaint on file.   (Consider location/radiation/quality/duration/timing/severity/associated sxs/prior treatment) Patient is a 78 y.o. female presenting with fall.  Fall The accident occurred 1 to 2 hours ago. Incident: Pt fell at her facility.  She does not know why she fell.  Her husband says that she has had several falls recently.  EMS was called, and they medicated her for pain with Fentanyl 250 mcg.  She was brought by EMS to [facility] ED for evaluation. Distance fallen: She apparently fell from standing. Point of impact: Buttocks. Pain location: Lower back. The pain is at a severity of 5/10. The pain is moderate. She was not ambulatory at the scene. There was no entrapment after the fall. There was no drug use involved in the accident. There was no alcohol use involved in the accident. Treatment on scene includes a c-collar and a backboard. Treatments tried: Pt was given IV Fentanyl, with some improvement.    Past Medical History  Diagnosis Date  . Vitamin d deficiency   . Hypercholesterolemia   . Alzheimer disease   . Hypertension   . Migraine     No past surgical history on file.  No family history on file.  History  Substance Use Topics  . Smoking status: Not on file  . Smokeless tobacco: Not on file  . Alcohol Use: No    OB History    Grav Para Term Preterm Abortions TAB SAB Ect Mult Living                  Review of Systems  Constitutional: Negative.   HENT: Negative.   Eyes: Negative.   Respiratory: Negative.   Cardiovascular: Negative.   Gastrointestinal: Negative.   Genitourinary: Negative.   Musculoskeletal: Positive for back pain.  Neurological: Negative.   Psychiatric/Behavioral: Negative.     Allergies  Aricept and Penicillins  Home Medications   Current Outpatient Rx  Name Route Sig Dispense Refill    . ACETAMINOPHEN 500 MG PO TABS Oral Take 500-1,000 mg by mouth every 6 (six) hours as needed. For pain    . ALENDRONATE SODIUM 70 MG PO TABS Oral Take 70 mg by mouth every 7 (seven) days. Take with a full glass of water on an empty stomach.    Marland Kitchen CALCIUM CARBONATE-VITAMIN D 500-200 MG-UNIT PO TABS Oral Take 1 tablet by mouth 2 (two) times daily.    Marland Kitchen VITAMIN D 1000 UNITS PO TABS Oral Take 1,000 Units by mouth daily.    Marland Kitchen ESCITALOPRAM OXALATE 10 MG PO TABS Oral Take 10 mg by mouth daily.    Marland Kitchen MEMANTINE HCL 10 MG PO TABS Oral Take 10 mg by mouth 2 (two) times daily.    Marland Kitchen OMEPRAZOLE 20 MG PO CPDR Oral Take 20 mg by mouth daily.    Marland Kitchen SIMVASTATIN 40 MG PO TABS Oral Take 40 mg by mouth every evening.    Marland Kitchen TRAMADOL HCL 50 MG PO TABS Oral Take 50 mg by mouth every 6 (six) hours as needed. For pain    . VITAMIN D (ERGOCALCIFEROL) 50000 UNITS PO CAPS Oral Take 50,000 Units by mouth.      There were no vitals taken for this visit.  Physical Exam  Nursing note and vitals reviewed. Constitutional: She appears well-developed and well-nourished.  Pt immobilized on a backboard with cervical collar in place.  HENT:  Head: Normocephalic and atraumatic.  Right Ear: External ear normal.  Left Ear: External ear normal.  Mouth/Throat: Oropharynx is clear and moist.  Eyes: Conjunctivae and EOM are normal. Pupils are equal, round, and reactive to light.  Neck: Normal range of motion. Neck supple.       No bony deformity.  No pain on ROM of neck. Cervical collar and backboard removed by me.   Cardiovascular: Normal rate, regular rhythm and normal heart sounds.   Pulmonary/Chest: Effort normal and breath sounds normal.  Abdominal: Soft. Bowel sounds are normal.  Musculoskeletal:       Pt log roller --> pain in the midlumbar region.  No palpable deformity or point tenderness.   Neurological: She is alert.       No sensory or motor deficit.   Skin: Skin is warm and dry.  Psychiatric: She has a normal  mood and affect. Her behavior is normal.    ED Course  Procedures (including critical care time)   Labs Reviewed  PROTIME-INR  APTT  CBC  DIFFERENTIAL  COMPREHENSIVE METABOLIC PANEL  URINALYSIS, ROUTINE W REFLEX MICROSCOPIC  URINE CULTURE   1:27 PM  Date: 04/21/2011  Rate: 71  Rhythm: normal sinus rhythm  QRS Axis: normal  Intervals: PR prolonged and QT prolonged  ST/T Wave abnormalities: nonspecific T wave changes  Conduction Disutrbances:first-degree A-V block   Narrative Interpretation: Borderline EKG.  Old EKG Reviewed: changes noted--Has borderline prolonged PR interval and QT interval today.  2:27 PM Results for orders placed during the hospital encounter of 04/21/11  CBC      Component Value Range   WBC 10.1  4.0 - 10.5 (K/uL)   RBC 4.39  3.87 - 5.11 (MIL/uL)   Hemoglobin 13.5  12.0 - 15.0 (g/dL)   HCT 45.4  09.8 - 11.9 (%)   MCV 92.5  78.0 - 100.0 (fL)   MCH 30.8  26.0 - 34.0 (pg)   MCHC 33.3  30.0 - 36.0 (g/dL)   RDW 14.7  82.9 - 56.2 (%)   Platelets 176  150 - 400 (K/uL)  DIFFERENTIAL      Component Value Range   Neutrophils Relative 85 (*) 43 - 77 (%)   Neutro Abs 8.6 (*) 1.7 - 7.7 (K/uL)   Lymphocytes Relative 9 (*) 12 - 46 (%)   Lymphs Abs 0.9  0.7 - 4.0 (K/uL)   Monocytes Relative 6  3 - 12 (%)   Monocytes Absolute 0.6  0.1 - 1.0 (K/uL)   Eosinophils Relative 0  0 - 5 (%)   Eosinophils Absolute 0.0  0.0 - 0.7 (K/uL)   Basophils Relative 0  0 - 1 (%)   Basophils Absolute 0.0  0.0 - 0.1 (K/uL)  COMPREHENSIVE METABOLIC PANEL      Component Value Range   Sodium 135  135 - 145 (mEq/L)   Potassium 4.4  3.5 - 5.1 (mEq/L)   Chloride 100  96 - 112 (mEq/L)   CO2 26  19 - 32 (mEq/L)   Glucose, Bld 120 (*) 70 - 99 (mg/dL)   BUN 16  6 - 23 (mg/dL)   Creatinine, Ser 1.30  0.50 - 1.10 (mg/dL)   Calcium 8.8  8.4 - 86.5 (mg/dL)   Total Protein 6.5  6.0 - 8.3 (g/dL)   Albumin 3.9  3.5 - 5.2 (g/dL)   AST 24  0 - 37 (U/L)   ALT 27  0 - 35 (U/L)   Alkaline  Phosphatase 53  39 - 117 (U/L)   Total Bilirubin 0.5  0.3 - 1.2 (mg/dL)   GFR calc non Af Amer 78 (*) >90 (mL/min)   GFR calc Af Amer >90  >90 (mL/min)  PROTIME-INR      Component Value Range   Prothrombin Time 13.9  11.6 - 15.2 (seconds)   INR 1.05  0.00 - 1.49   APTT      Component Value Range   aPTT 31  24 - 37 (seconds)   Dg Thoracic Spine 2 View  04/21/2011  *RADIOLOGY REPORT*  Clinical Data: Back pain  THORACIC SPINE - 2 VIEW  Comparison: 05/19/2004  Findings: Slight dextroscoliosis of the lower thoracic spine.  L1 compression deformity is noted.  No compression fracture is visualized in the thoracic spine.  Osteopenia.  Limited images of the cervical spine demonstrates significant narrowing at C5-6 with posterior osteophytic ridging.  IMPRESSION: No acute thoracic injury.  L1 compression deformity is noted.  Original Report Authenticated By: Donavan Burnet, M.D.   Dg Lumbar Spine Complete  04/21/2011  *RADIOLOGY REPORT*  Clinical Data: Fall  LUMBAR SPINE - COMPLETE 4+ VIEW  Comparison: None.  Findings: There is an acute appearing L1 compression fracture. There is suspected to be at least 3 mm retropulsion of the superior endplate.  There is proximally 50% loss of height anteriorly. Osteopenia.  Anatomic alignment.  Mild narrowing of the L3-4 and L4- 5 discs.  Facet arthropathy in the lower lumbar spine.  Vascular calcifications are prominent.  IMPRESSION: L1 compression fracture with acute fracture lines and suspected retropulsion of the superior endplate.  MRI or CT may be helpful to further characterize.  Original Report Authenticated By: Donavan Burnet, M.D.   Dg Pelvis 1-2 Views  04/21/2011  *RADIOLOGY REPORT*  Clinical Data: Low back pain.  Fall.  PELVIS - 1-2 VIEW  Comparison: None.  Findings: No acute fracture and no dislocation.  Unremarkable soft tissues.  IMPRESSION: No acute bony pathology.  Original Report Authenticated By: Donavan Burnet, M.D.   Ct Head Wo  Contrast  04/21/2011  *RADIOLOGY REPORT*  Clinical Data:  78 year old female with head and neck injury, headache and neck pain following fall.  CT HEAD WITHOUT CONTRAST CT CERVICAL SPINE WITHOUT CONTRAST  Technique:  Multidetector CT imaging of the head and cervical spine was performed following the standard protocol without intravenous contrast.  Multiplanar CT image reconstructions of the cervical spine were also generated.  Comparison:  03/22/2011 head CT  CT HEAD  Findings: Mild chronic small vessel white matter ischemic changes are again identified. Equivocal low attenuation within the right to mid brain and pons is noted - suspect artifact but a subacute to remote infarct is not excluded. No acute intracranial abnormalities are identified, including mass lesion or mass effect, hydrocephalus, extra-axial fluid collection, midline shift, hemorrhage, or acute infarction.  The visualized bony calvarium is unremarkable.  IMPRESSION: No evidence of acute intracranial abnormality.  Possible subacute to remote right mid brain - pontine infarct, although this finding could represent artifact.  Mild chronic small vessel white matter ischemic changes.  CT CERVICAL SPINE  Findings: Normal alignment is noted. There is no evidence of fracture, subluxation or prevertebral soft tissue swelling. Moderate degenerative disc disease and spondylosis at C5-C6 noted. Mild to moderate multilevel facet arthropathy throughout the cervical spine noted. No suspicious focal bony lesions are identified.  The soft tissue structures are unremarkable.  IMPRESSION: No static  evidence of acute injury to the cervical spine.  Moderate degenerative disc disease at C5-C6 and mild to moderate multilevel facet arthropathy.  Original Report Authenticated By: Rosendo Gros, M.D.   Ct Head Wo Contrast  03/22/2011  *RADIOLOGY REPORT*  Clinical Data: 78 year old female with dizziness.  Fall, headache.  CT HEAD WITHOUT CONTRAST  Technique:  Contiguous  axial images were obtained from the base of the skull through the vertex without contrast.  Comparison: Brain MRI 10/07/2007.  Findings: No acute orbit or scalp soft tissue findings. Visualized paranasal sinuses and mastoids are clear.  Hyperostosis of the calvarium.  Calcified atherosclerosis at the skull base.  Chronic cerebral white matter changes appear progressed.  No ventriculomegaly. No midline shift, mass effect, or evidence of mass lesion.  No acute intracranial hemorrhage identified.  No evidence of cortically based acute infarction identified.  No suspicious intracranial vascular hyperdensity.  Dominant left vertebral artery re- identified.  IMPRESSION: No acute intracranial abnormality.  Progression of nonspecific cerebral white matter changes since 2009.  Original Report Authenticated By: Harley Hallmark, M.D.   Ct Cervical Spine Wo Contrast  04/21/2011  *RADIOLOGY REPORT*  Clinical Data:  78 year old female with head and neck injury, headache and neck pain following fall.  CT HEAD WITHOUT CONTRAST CT CERVICAL SPINE WITHOUT CONTRAST  Technique:  Multidetector CT imaging of the head and cervical spine was performed following the standard protocol without intravenous contrast.  Multiplanar CT image reconstructions of the cervical spine were also generated.  Comparison:  03/22/2011 head CT  CT HEAD  Findings: Mild chronic small vessel white matter ischemic changes are again identified. Equivocal low attenuation within the right to mid brain and pons is noted - suspect artifact but a subacute to remote infarct is not excluded. No acute intracranial abnormalities are identified, including mass lesion or mass effect, hydrocephalus, extra-axial fluid collection, midline shift, hemorrhage, or acute infarction.  The visualized bony calvarium is unremarkable.  IMPRESSION: No evidence of acute intracranial abnormality.  Possible subacute to remote right mid brain - pontine infarct, although this finding could  represent artifact.  Mild chronic small vessel white matter ischemic changes.  CT CERVICAL SPINE  Findings: Normal alignment is noted. There is no evidence of fracture, subluxation or prevertebral soft tissue swelling. Moderate degenerative disc disease and spondylosis at C5-C6 noted. Mild to moderate multilevel facet arthropathy throughout the cervical spine noted. No suspicious focal bony lesions are identified.  The soft tissue structures are unremarkable.  IMPRESSION: No static evidence of acute injury to the cervical spine.  Moderate degenerative disc disease at C5-C6 and mild to moderate multilevel facet arthropathy.  Original Report Authenticated By: Rosendo Gros, M.D.    Pt has an acute L1 compression fracture with 3 mm retropulsion of the superior endplate.  Other x-rays negative.  Pt has been treated by Dr. Trey Sailors of [facility] in the past.  Call to Dr. Tressie Stalker, --> Dr. Marikay Alar came and saw pt.  He recommended CT of the lumbar spine, which showed 60% compression fracture of the L1 vertebra, without spinal stenosis.  Call to Dr. Yetta Barre --> Pt to be placed in an LSO brace and admission for pain control and observation.     1. Compression fracture of lumbar vertebra          Carleene Cooper III, MD 04/21/11 [phone]

## 2011-04-22 ENCOUNTER — Other Ambulatory Visit (HOSPITAL_COMMUNITY): Payer: Medicare Other

## 2011-04-22 LAB — URINE CULTURE
Colony Count: NO GROWTH
Culture: NO GROWTH

## 2011-04-22 LAB — GLUCOSE, CAPILLARY

## 2011-04-22 LAB — HEMOGLOBIN A1C: Hgb A1c MFr Bld: 6.1 % — ABNORMAL HIGH (ref <5.7)

## 2011-04-22 NOTE — Progress Notes (Signed)
I spoke with Pt and her husband about the plan of care. Pt presently in ALF and will likely need to be discharged to SNF at time of discharge. Pt and husband agreeable with plans.  Pt seen and examined and discussed with NP Toya Smothers. Agree with assessmantr and plan. Anticipate D/C tomorrow to SNF with TLSO.

## 2011-04-22 NOTE — Progress Notes (Signed)
Subjective: " my back hurts when i move." No events during night.   Objective: Vital signs Filed Vitals:   04/21/11 1934 04/21/11 2000 04/21/11 2220 04/22/11 0500  BP: 133/69 126/61 132/77 104/63  Pulse: 79 74 86 88  Temp: 98 F (36.7 C)  98.4 F (36.9 C) 98.6 F (37 C)  TempSrc: Oral  Oral Oral  Resp: 18 15 16 16   Height:   5\' 4"  (1.626 m)   Weight:   6.8 kg (14 lb 15.9 oz)   SpO2: 96% 97% 94% 91%   Weight change:     Intake/Output from previous day: 02/23 0701 - 02/24 0700 In: 80 [I.V.:80] Out: 200 [Urine:200] Total I/O In: 100 [P.O.:100] Out: -    Physical Exam: General: Alert, awake, oriented to self, in no acute distress. HEENT: No bruits, no goiter. PERRL Mucus membranes moist/pink Heart: Regular rate and rhythm, without murmurs, rubs, gallops. Lungs:Normal effort. Breath sounds  clear to auscultation bilaterally. No wheeze, rhonchi Abdomen: Soft, nontender, nondistended, positive bowel sounds. Extremities: No clubbing cyanosis or edema with positive pedal pulses. Neuro: Grossly intact, nonfocal. Cranial nerve II-XII intact. MOE.     Lab Results: Basic Metabolic Panel:  Basename 04/21/11 1300  NA 135  K 4.4  CL 100  CO2 26  GLUCOSE 120*  BUN 16  CREATININE 0.71  CALCIUM 8.8  MG --  PHOS --   Liver Function Tests:  Basename 04/21/11 1300  AST 24  ALT 27  ALKPHOS 53  BILITOT 0.5  PROT 6.5  ALBUMIN 3.9   No results found for this basename: LIPASE:2,AMYLASE:2 in the last 72 hours No results found for this basename: AMMONIA:2 in the last 72 hours CBC:  Basename 04/21/11 1300  WBC 10.1  NEUTROABS 8.6*  HGB 13.5  HCT 40.6  MCV 92.5  PLT 176   Cardiac Enzymes: No results found for this basename: CKTOTAL:3,CKMB:3,CKMBINDEX:3,TROPONINI:3 in the last 72 hours BNP: No results found for this basename: PROBNP:3 in the last 72 hours D-Dimer: No results found for this basename: DDIMER:2 in the last 72 hours CBG:  Basename 04/22/11 0743    GLUCAP 102*   Hemoglobin A1C:  Basename 04/21/11 1300  HGBA1C 6.1*   Fasting Lipid Panel: No results found for this basename: CHOL,HDL,LDLCALC,TRIG,CHOLHDL,LDLDIRECT in the last 72 hours Thyroid Function Tests: No results found for this basename: TSH,T4TOTAL,FREET4,T3FREE,THYROIDAB in the last 72 hours Anemia Panel: No results found for this basename: VITAMINB12,FOLATE,FERRITIN,TIBC,IRON,RETICCTPCT in the last 72 hours Coagulation:  Basename 04/21/11 1300  LABPROT 13.9  INR 1.05   Urine Drug Screen: Drugs of Abuse  No results found for this basename: labopia, cocainscrnur, labbenz, amphetmu, thcu, labbarb    Alcohol Level: No results found for this basename: ETH:2 in the last 72 hours Urinalysis:  Basename 04/21/11 1421  COLORURINE YELLOW  LABSPEC 1.016  PHURINE 7.0  GLUCOSEU NEGATIVE  HGBUR NEGATIVE  BILIRUBINUR NEGATIVE  KETONESUR NEGATIVE  PROTEINUR NEGATIVE  UROBILINOGEN 0.2  NITRITE NEGATIVE  LEUKOCYTESUR NEGATIVE   Misc. Labs:  No results found for this or any previous visit (from the past 240 hour(s)).  Studies/Results: Dg Thoracic Spine 2 View  04/21/2011  *RADIOLOGY REPORT*  Clinical Data: Back pain  THORACIC SPINE - 2 VIEW  Comparison: 05/19/2004  Findings: Slight dextroscoliosis of the lower thoracic spine.  L1 compression deformity is noted.  No compression fracture is visualized in the thoracic spine.  Osteopenia.  Limited images of the cervical spine demonstrates significant narrowing at C5-6 with posterior osteophytic ridging.  IMPRESSION:  No acute thoracic injury.  L1 compression deformity is noted.  Original Report Authenticated By: Donavan Burnet, M.D.   Dg Lumbar Spine Complete  04/21/2011  *RADIOLOGY REPORT*  Clinical Data: Fall  LUMBAR SPINE - COMPLETE 4+ VIEW  Comparison: None.  Findings: There is an acute appearing L1 compression fracture. There is suspected to be at least 3 mm retropulsion of the superior endplate.  There is proximally 50%  loss of height anteriorly. Osteopenia.  Anatomic alignment.  Mild narrowing of the L3-4 and L4- 5 discs.  Facet arthropathy in the lower lumbar spine.  Vascular calcifications are prominent.  IMPRESSION: L1 compression fracture with acute fracture lines and suspected retropulsion of the superior endplate.  MRI or CT may be helpful to further characterize.  Original Report Authenticated By: Donavan Burnet, M.D.   Dg Pelvis 1-2 Views  04/21/2011  *RADIOLOGY REPORT*  Clinical Data: Low back pain.  Fall.  PELVIS - 1-2 VIEW  Comparison: None.  Findings: No acute fracture and no dislocation.  Unremarkable soft tissues.  IMPRESSION: No acute bony pathology.  Original Report Authenticated By: Donavan Burnet, M.D.   Ct Head Wo Contrast  04/21/2011  *RADIOLOGY REPORT*  Clinical Data:  78 year old female with head and neck injury, headache and neck pain following fall.  CT HEAD WITHOUT CONTRAST CT CERVICAL SPINE WITHOUT CONTRAST  Technique:  Multidetector CT imaging of the head and cervical spine was performed following the standard protocol without intravenous contrast.  Multiplanar CT image reconstructions of the cervical spine were also generated.  Comparison:  03/22/2011 head CT  CT HEAD  Findings: Mild chronic small vessel white matter ischemic changes are again identified. Equivocal low attenuation within the right to mid brain and pons is noted - suspect artifact but a subacute to remote infarct is not excluded. No acute intracranial abnormalities are identified, including mass lesion or mass effect, hydrocephalus, extra-axial fluid collection, midline shift, hemorrhage, or acute infarction.  The visualized bony calvarium is unremarkable.  IMPRESSION: No evidence of acute intracranial abnormality.  Possible subacute to remote right mid brain - pontine infarct, although this finding could represent artifact.  Mild chronic small vessel white matter ischemic changes.  CT CERVICAL SPINE  Findings: Normal alignment is  noted. There is no evidence of fracture, subluxation or prevertebral soft tissue swelling. Moderate degenerative disc disease and spondylosis at C5-C6 noted. Mild to moderate multilevel facet arthropathy throughout the cervical spine noted. No suspicious focal bony lesions are identified.  The soft tissue structures are unremarkable.  IMPRESSION: No static evidence of acute injury to the cervical spine.  Moderate degenerative disc disease at C5-C6 and mild to moderate multilevel facet arthropathy.  Original Report Authenticated By: Rosendo Gros, M.D.   Ct Cervical Spine Wo Contrast  04/21/2011  *RADIOLOGY REPORT*  Clinical Data:  78 year old female with head and neck injury, headache and neck pain following fall.  CT HEAD WITHOUT CONTRAST CT CERVICAL SPINE WITHOUT CONTRAST  Technique:  Multidetector CT imaging of the head and cervical spine was performed following the standard protocol without intravenous contrast.  Multiplanar CT image reconstructions of the cervical spine were also generated.  Comparison:  03/22/2011 head CT  CT HEAD  Findings: Mild chronic small vessel white matter ischemic changes are again identified. Equivocal low attenuation within the right to mid brain and pons is noted - suspect artifact but a subacute to remote infarct is not excluded. No acute intracranial abnormalities are identified, including mass lesion or mass effect, hydrocephalus, extra-axial  fluid collection, midline shift, hemorrhage, or acute infarction.  The visualized bony calvarium is unremarkable.  IMPRESSION: No evidence of acute intracranial abnormality.  Possible subacute to remote right mid brain - pontine infarct, although this finding could represent artifact.  Mild chronic small vessel white matter ischemic changes.  CT CERVICAL SPINE  Findings: Normal alignment is noted. There is no evidence of fracture, subluxation or prevertebral soft tissue swelling. Moderate degenerative disc disease and spondylosis at C5-C6  noted. Mild to moderate multilevel facet arthropathy throughout the cervical spine noted. No suspicious focal bony lesions are identified.  The soft tissue structures are unremarkable.  IMPRESSION: No static evidence of acute injury to the cervical spine.  Moderate degenerative disc disease at C5-C6 and mild to moderate multilevel facet arthropathy.  Original Report Authenticated By: Rosendo Gros, M.D.   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  **END ADDENDUM** SIGNED BY: Arnell Sieving, M.D.    04/21/2011  *RADIOLOGY REPORT*  Clinical Data: L1 compression fracture after a fall.  CT LUMBAR SPINE WITHOUT CONTRAST 04/21/2011:  Technique:  Multidetector CT imaging of the lumbar spine was performed without intravenous contrast administration. Multiplanar CT image reconstructions were also generated.  Comparison: Lumbar spine x-rays performed earlier same date.  Findings: Approximate 60% compression fracture of the L1 vertebral body, secondary to osteoporosis.  Slight retropulsion of the upper endplate fragment, without significant spinal stenosis.  No other fractures involving the lumbar spine.  No evidence of significant disc protrusion or extrusion at any lumbar level; chronic mild protrusion at L5 with calcification in the posterior annular fibers.  Neural foramina widely patent at each level, including L1- 2.  Mild facet degenerative changes at L4-5 and moderate to severe facet degenerative changes at L5-S1.  IMPRESSION:  1.  Approximate 60% osteoporotic compression fracture of the L1 vertebral body.  Slight retropulsion of the upper endplate fragment without evidence of spinal stenosis. By definition, the patient has established osteoporosis. 2.  No other fractures involving the lumbar  spine. 3.  Facet degenerative changes at L5-S1 greater than L4-5 without associated foraminal stenoses.  Original Report Authenticated By: Arnell Sieving, M.D.    Medications: Scheduled Meds:   . calcium-vitamin D  1 tablet Oral BID  . cholecalciferol  1,000 Units Oral Daily  . docusate sodium  100 mg Oral BID  . enoxaparin  40 mg Subcutaneous QHS  . escitalopram  10 mg Oral Daily  . fentaNYL  25 mcg Transdermal Q72H  .  HYDROmorphone (DILAUDID) injection  2 mg Intravenous Once  . memantine  10 mg Oral BID  . ondansetron      . pantoprazole  40 mg Oral Q1200  . simvastatin  40 mg Oral QPM  . DISCONTD: fentaNYL  100 mcg Intravenous Once   Continuous Infusions:   . sodium chloride 10 mL/hr at 04/22/11 0700  . DISCONTD: sodium chloride 125 mL/hr at 04/21/11 2014   PRN Meds:.sodium chloride, acetaminophen, bisacodyl, HYDROmorphone, oxyCODONE, sodium chloride, traZODone  Assessment/Plan:  Principal Problem:  *Lumbar compression fracture Active Problems:  Dementia  Gait abnormality  IGT (impaired glucose tolerance)  1. acute traumatic L1 compression fracture secondary to mechanical fall. Pt. admitted to the medical service for pain control; the patient will be treated with a LSO brace; PT and OT will be consult.  Pain fairly well controlled at rest. Has not been seen yet by PT or OT. Hopefully early discharge back to [facility] once pain controlled.  2. dementia of the Alzheimer's type. At baseline Continue Namenda  3. unsteady gait. Reportedly mechanical fall.  Has been on tele for 24 hours without arrythmia.  Patient does have a history of PSVT. Family reports recent work up for syncope negative. Has been in routine physical therapy at facility for gait issues.     LOS: 1 day   [facility] M 04/22/2011, 11:45 AM

## 2011-04-22 NOTE — Evaluation (Signed)
Physical Therapy Evaluation Patient Details Name: Dominique Martin MRN: [id number] DOB: 1926-04-21 Today's Date: 04/22/2011  Problem List:  Patient Active Problem List  Diagnoses  . HYPERLIPIDEMIA  . TREMOR, ESSENTIAL  . DIVERTICULOSIS, COLON  . OSTEOPOROSIS  . SUPRAVENTRICULAR TACHYCARDIA, HX OF  . COLONIC POLYPS, ADENOMATOUS, HX OF  . Personal history of other diseases of digestive disease  . Lumbar compression fracture  . Dementia  . Gait abnormality  . IGT (impaired glucose tolerance)    Past Medical History:  Past Medical History  Diagnosis Date  . Vitamin d deficiency   . Hypercholesterolemia   . Alzheimer disease   . Hypertension   . Migraine    Past Surgical History:  Past Surgical History  Procedure Date  . Bilateral salpingoophorectomy   . Carpal tunnel release     PT Assessment/Plan/Recommendation PT Assessment Clinical Impression Statement: 77 y.o. female with fall and L1 compression fracture (non-surgical treatment), h/o falls recently and also h/o dementia.  She presents today with decreased bil leg strength (functionally L>R), decrased balance, decreased mobility, decreased balance and decreased knowledge of back precautions and brace use.  She would benefit from acute PT to maximize her independence, functional mobility and safety so that she may successfully start SNF level rehabilitation at [facility].   PT Recommendation/Assessment: Patient will need skilled PT in the acute care venue PT Problem List: Decreased strength;Decreased mobility;Decreased balance;Decreased coordination;Decreased cognition;Decreased knowledge of use of DME;Decreased safety awareness;Decreased knowledge of precautions;Pain PT Therapy Diagnosis : Difficulty walking;Abnormality of gait;Generalized weakness;Acute pain PT Plan PT Frequency: Min 3X/week PT Treatment/Interventions: DME instruction;Gait training;Functional mobility training;Therapeutic activities;Therapeutic  exercise;Balance training;Neuromuscular re-education;Cognitive remediation;Patient/family education PT Recommendation Recommendations for Other Services: OT consult Follow Up Recommendations: Skilled nursing facility Equipment Recommended: Defer to next venue PT Goals  Acute Rehab PT Goals PT Goal Formulation: With patient/family Time For Goal Achievement: 2 weeks Pt will Roll Supine to Right Side: with modified independence PT Goal: Rolling Supine to Right Side - Progress: Goal set today Pt will Roll Supine to Left Side: with modified independence PT Goal: Rolling Supine to Left Side - Progress: Goal set today Pt will go Supine/Side to Sit: with min assist PT Goal: Supine/Side to Sit - Progress: Goal set today Pt will go Sit to Supine/Side: with min assist PT Goal: Sit to Supine/Side - Progress: Goal set today Pt will go Sit to Stand: with min assist PT Goal: Sit to Stand - Progress: Goal set today Pt will go Stand to Sit: with min assist PT Goal: Stand to Sit - Progress: Goal set today Pt will Transfer Bed to Chair/Chair to Bed: with min assist PT Transfer Goal: Bed to Chair/Chair to Bed - Progress: Goal set today Pt will Ambulate: 16 - 50 feet;with min assist;with rolling walker PT Goal: Ambulate - Progress: Goal set today  PT Evaluation Precautions/Restrictions  Precautions Precautions: Fall Precaution Comments: dementia Required Braces or Orthoses: Yes Spinal Brace: Thoracolumbosacral orthotic (donned in bed) Prior Functioning  Home Living Lives With:  (@ [facility]) Receives Help From: Personal care attendant (staff at ALF) Type of Home: Apartment Home Layout: One level Home Access: Level entry Home Adaptive Equipment: Walker - rolling Prior Function Level of Independence: Needs assistance with homemaking;Needs assistance with ADLs Comments: patient with history of falls at ALF Cognition Cognition Arousal/Alertness: Awake/alert Cognition - Other Comments:  dementia at baseline Extremity Assessment RLE Strength RLE Overall Strength: Deficits RLE Overall Strength Comments: grossly 3-/5 per functional assessment LLE Strength LLE Overall Strength: Deficits  LLE Overall Strength Comments: grossly 3-/5 per functional assessment.   Mobility (including Balance) Bed Mobility Bed Mobility: Yes Rolling Right: 3: Mod assist;With rail Rolling Right Details (indicate cue type and reason): mod assist to roll her hips and trunk over.  Hand over hand for her to reach for railing.   Rolling Left: 3: Mod assist;With rail Rolling Left Details (indicate cue type and reason): mod assist to roll her hips and trunk over.  Hand over hand for her to reach for railing.   Right Sidelying to Sit: HOB elevated (comment degrees);With rails;3: Mod assist (HOB 20 degrees) Right Sidelying to Sit Details (indicate cue type and reason): mod assist to help support trunk during transition and remove hands from rail (the patient would not let go).  Verbal cues for hand placement and technique.   Sitting - Scoot to Edge of Bed: 4: Min assist Sitting - Scoot to [place] of Bed Details (indicate cue type and reason): min assist to help weight shift hips to scoot out.  Patient is supporting herself and helping scoot with bil arms.   Transfers Transfers: Yes Sit to Stand: 3: Mod assist;From elevated surface;With upper extremity assist;From bed;With armrests;From chair/3-in-1 Sit to Stand Details (indicate cue type and reason): sit to stand x2 once from bed and once from recliner chair.  Mod assist to support trunk over weak legs and to stabilize RW.  Max verbal cues for hand placement and manual assist needed for hand placment.   Stand to Sit: 3: Mod assist;To chair/3-in-1;With armrests;With upper extremity assist Stand to Sit Details: mod assist to help control descent to sit and to guide patient's hands to armrests on the chair.  The patient needed max verbal cues for  safety Ambulation/Gait Ambulation/Gait: Yes Ambulation/Gait Assistance: 3: Mod assist Ambulation/Gait Assistance Details (indicate cue type and reason): mod assist to support trunk for balance, steer RW and max verbal cues to take larger steps, to stay closer to the RW for stability and to keep knees straight.  Towards the end of the walk increased kneed flexion and buckling bil.  Patient seems to leave/drag left leg further behind right leg.  Chair to follow for safety.   Ambulation Distance (Feet): 20 Feet Assistive device: Rolling walker Gait Pattern: Shuffle;Trunk flexed;Decreased step length - right;Decreased step length - left (left step length shorter than right)    End of Session PT - End of Session Equipment Utilized During Treatment: Gait belt;Back brace (RW) Activity Tolerance: Patient limited by pain Patient left: in chair;with call bell in reach;with family/visitor present (husband) Nurse Communication: Mobility status for ambulation;Mobility status for transfers (pain meds requested) General Behavior During Session: [facility] for tasks performed Cognition: Impaired, at baseline  Kris Burd B. Tomasz Steeves, PT, DPT [phone] 04/22/2011, 2:03 PM

## 2011-04-23 ENCOUNTER — Observation Stay (HOSPITAL_COMMUNITY): Payer: Medicare Other

## 2011-04-23 LAB — D-DIMER, QUANTITATIVE: D-Dimer, Quant: 1.6 ug/mL-FEU — ABNORMAL HIGH (ref 0.00–0.48)

## 2011-04-23 LAB — GLUCOSE, CAPILLARY

## 2011-04-23 MED ORDER — OXYCODONE HCL 5 MG PO TABS: 5.0000 mg | ORAL_TABLET | ORAL | Status: AC | PRN

## 2011-04-23 MED ORDER — FENTANYL 25 MCG/HR TD PT72: 1.0000 | MEDICATED_PATCH | TRANSDERMAL | Status: AC

## 2011-04-23 MED ORDER — DSS 100 MG PO CAPS: 100.0000 mg | ORAL_CAPSULE | Freq: Two times a day (BID) | ORAL | Status: AC

## 2011-04-23 NOTE — Discharge Summary (Signed)
Pt seen and examined and discussed with NP Cordelia Pen. Agree with assessment and plan. Pt to be D/C'ed to SNF level once bed available. Will notify Dr. Clelia Croft of change in level of care.

## 2011-04-23 NOTE — Progress Notes (Signed)
Patient ID: Dominique Martin, female   DOB: November 23, 1926, 78 y.o.   MRN: [id number] Rec'd call from nurse Lurena Joiner stating that pt's oxygen saturation on D/C vitals were 865 on RA. Pt has no previous record of oxygen use.  On examination, Pt is breathing comfortably  And in no distress. RR: 16, HR: 80, BP 123/67,  Lung: CTA COR: S1S2 normal  EXT: warm and dry  A/P  Unexplained hypoxemia: will check CXR, and check D-dimer, Con't oxygen support. Hold D/C to SNF.

## 2011-04-23 NOTE — Progress Notes (Signed)
UR complete 

## 2011-04-23 NOTE — Discharge Summary (Signed)
Physician Discharge Summary  Patient ID: Dominique Martin MRN: [id number] DOB/AGE: 1926-10-11 78 y.o.  PCP: Eric Form, MD  Admit date: 04/21/2011 Discharge date: 04/23/2011    Discharge Diagnoses:  1. Acute, traumatic L1 compression fracture 2. Alzheimer's dementia 3. Unsteady gait, chronic 4. Hx hypertension 5. Vitamin D Deficiency 6. Hypercholesterolemia 7. Hx migraine headaches  Consults: Dr. Marikay Alar, Neurosurgery  Brief Summary: Dominique Martin is a 78 y.o. y/o female with a PMH of dementia, chronic gait instability with frequent falls and hypertension who presented from [facility] on 2/23 after a fall and subsequent back pain. Work-up in the ED revealed an acute L1 compression fracture. Neurosurgery was consulted. Patient was admitted by [facility] for pain management and physical therapy.  Hospital Course by Discharge Summary 1. Acute, traumatic L1 compression fracture: Acute injury due to mechanical fall just prior to arrival. Pt's cervical spine and head were cleared with CT on ED evaluation. Due to patient's pain and placement issues, pt was admitted to the floor for further evaluation and treatment. Patient was seen in consultation by neurosurgery. Given patient's advanced age and with dementia, surgery is not ideal. Patient is to mobilize with LSO brace and will follow-up in NS office. She has been up with PT and is requiring a great deal of assistance with mobilization. Will defer upright films to NS at outpatient follow-up as patient is unable to do unassisted at this time.   2. Alzheimer's dementia: remained stable throughout hospitalization. Continue Namenda. SNF at discharge  3. Chronic unsteady gait: PT/OT, SNF at discharge.  Other chronic medical conditions stable.     Significant Diagnostic Studies:   CT LUMBAR SPINE WITHOUT CONTRAST 04/21/2011: IMPRESSION:  1. Approximate 60% osteoporotic compression fracture of the L1  vertebral body.  Slight retropulsion of the upper endplate fragment  without evidence of spinal stenosis. By definition, the patient has  established osteoporosis.  2. No other fractures involving the lumbar spine.  3. Facet degenerative changes at L5-S1 greater than L4-5 without  associated foraminal stenoses.  Original Report Authenticated By: Arnell Sieving, M.D.  CT HEAD IMPRESSION:  No evidence of acute intracranial abnormality.  Possible subacute to remote right mid brain - pontine infarct,  although this finding could represent artifact.  Mild chronic small vessel white matter ischemic changes.  CT CERVICAL SPINE IMPRESSION:  No static evidence of acute injury to the cervical spine.  Moderate degenerative disc disease at C5-C6 and mild to moderate  multilevel facet arthropathy.  Original Report Authenticated By: Rosendo Gros, M.D.       Discharge Exam: General: Awake, alert lying supine in bed in NAD CV: S1S2 RRR, no m/r/g, no LE edema Resp: CTAB, no w/r/c, no increased wob GI: abdomen soft, NT/ND, BS+ Neuro: no focal m/s deficits on exam Psych: flat affect, at baseline per husband.    Discharge Labs  BMET  Lab 04/21/11 1300  NA 135  K 4.4  CL 100  CO2 26  GLUCOSE 120*  BUN 16  CREATININE 0.71  CALCIUM 8.8  MG --  PHOS --     CBC   Lab 04/21/11 1300  HGB 13.5  HCT 40.6  WBC 10.1  PLT 176     Medication List  As of 04/23/2011 10:16 AM   START taking these medications         DSS 100 MG Caps   Take 100 mg by mouth 2 (two) times daily.      fentaNYL 25  MCG/HR   Commonly known as: DURAGESIC - dosed mcg/hr   Place 1 patch (25 mcg total) onto the skin every 3 (three) days.      oxyCODONE 5 MG immediate release tablet   Commonly known as: Oxy IR/ROXICODONE   Take 1 tablet (5 mg total) by mouth every 4 (four) hours as needed.         CONTINUE taking these medications         acetaminophen 500 MG tablet   Commonly known as: TYLENOL      alendronate  70 MG tablet   Commonly known as: FOSAMAX      calcium-vitamin D 500-200 MG-UNIT per tablet   Commonly known as: OSCAL WITH D      cholecalciferol 1000 UNITS tablet   Commonly known as: VITAMIN D      escitalopram 10 MG tablet   Commonly known as: LEXAPRO      memantine 10 MG tablet   Commonly known as: NAMENDA      omeprazole 20 MG capsule   Commonly known as: PRILOSEC      simvastatin 40 MG tablet   Commonly known as: ZOCOR      Vitamin D (Ergocalciferol) 50000 UNITS Caps   Commonly known as: DRISDOL         STOP taking these medications         traMADol 50 MG tablet          Where to get your medications    These are the prescriptions that you need to pick up.   You may get these medications from any pharmacy.         fentaNYL 25 MCG/HR   oxyCODONE 5 MG immediate release tablet         Information on where to get these meds is not yet available. Ask your nurse or doctor.         DSS 100 MG Caps             Disposition:  To SNF when bed available  Discharged Condition: Dominique Martin has met maximum benefit of inpatient care and is medically stable and cleared for discharge.  Patient is pending follow up as above.      Time spent on disposition:  35 minutes.   Cordelia Pen, NP-C [facility]  pgr [phone]

## 2011-04-24 ENCOUNTER — Encounter (HOSPITAL_COMMUNITY): Payer: Self-pay | Admitting: Radiology

## 2011-04-24 ENCOUNTER — Observation Stay (HOSPITAL_COMMUNITY): Payer: Medicare Other

## 2011-04-24 LAB — GLUCOSE, CAPILLARY: Glucose-Capillary: 104 mg/dL — ABNORMAL HIGH (ref 70–99)

## 2011-04-24 MED ORDER — FUROSEMIDE 10 MG/ML IJ SOLN
20.0000 mg | Freq: Once | INTRAMUSCULAR | Status: AC
  Administered 2011-04-24: 20 mg via INTRAVENOUS
  Filled 2011-04-24: qty 2

## 2011-04-24 MED ORDER — IOHEXOL 300 MG/ML  SOLN
80.0000 mL | Freq: Once | INTRAMUSCULAR | Status: AC | PRN
  Administered 2011-04-24: 80 mL via INTRAVENOUS

## 2011-04-24 NOTE — Progress Notes (Signed)
Physical Therapy Treatment Patient Details Name: Dominique Martin MRN: [id number] DOB: [birth year] Today's Date: 04/24/2011  PT Assessment/Plan  PT - Assessment/Plan Comments on Treatment Session: Pt progressing slowly with activity but still significantly limited by pain. Requires MAX cues throughout session.  PT Plan: Discharge plan remains appropriate Follow Up Recommendations: Skilled nursing facility Equipment Recommended: Defer to next venue PT Goals  Acute Rehab PT Goals PT Goal: Rolling Supine to Right Side - Progress: Progressing toward goal PT Goal: Rolling Supine to Left Side - Progress: Progressing toward goal PT Goal: Supine/Side to Sit - Progress: Progressing toward goal PT Goal: Sit to Stand - Progress: Progressing toward goal PT Goal: Stand to Sit - Progress: Progressing toward goal PT Transfer Goal: Bed to Chair/Chair to Bed - Progress: Progressing toward goal PT Goal: Ambulate - Progress: Progressing toward goal  PT Treatment Precautions/Restrictions  Precautions Precautions: Fall Precaution Comments: dementia Required Braces or Orthoses: Yes Spinal Brace: Thoracolumbosacral orthotic Restrictions Weight Bearing Restrictions: No Mobility (including Balance) Bed Mobility Bed Mobility: Yes Rolling Right: 3: Mod assist Rolling Right Details (indicate cue type and reason): Assist to initiate and complete task. Multimodal cues for safety, technique, hand placement.  Rolling Left: 3: Mod assist Rolling Left Details (indicate cue type and reason): Assist to initiate and complete task. Multi-modal cues for safety, technique, hand placement Right Sidelying to Sit: 2: Max assist Right Sidelying to Sit Details (indicate cue type and reason): Assist for bil LEs off EOB and trunk to upright. Assist to stabilize in sitting. Pt resisting initially and pushing into extension at EOB.  Transfers Transfers: Yes Sit to Stand: 3: Mod assist;From bed;From chair/3-in-1 Sit to Stand  Details (indicate cue type and reason): x 2. Multimodal cues for safety, technique, hand placement. Pt fearful and experiencing pain. Assist to flex trunk and shift weight anteriorly, rise, stabilize, and place hand on sitting surface/armrests.  Stand to Sit: To chair/3-in-1 Stand to Sit Details: x 2. Multimodal cues for safety, technique, hand placement. Assist to rise, stabile, and place hands on armrest.  Stand Pivot Transfers: 3: Mod assist Stand Pivot Transfer Details (indicate cue type and reason): with RW. x 2. Recliner<>BSC. Assist to stabilize and negotiate with RW. Ambulation/Gait Ambulation/Gait: Yes Ambulation/Gait Assistance: 1: +2 Total assist;3: Mod assist Ambulation/Gait Assistance Details (indicate cue type and reason): A for staiblity and RW negotiation. Multimodal cues for safety, posture(extension of trunk/knees), increased step length. Fatigues easily. Followed with recliner. O2 sats 89-90% on RA after ambulation. Ambulation Distance (Feet): 35 Feet Assistive device: Rolling walker Gait Pattern: Step-through pattern;Decreased step length - left;Decreased step length - right;Trunk flexed    Exercise    End of Session PT - End of Session Equipment Utilized During Treatment: Gait belt;Back brace Activity Tolerance: Patient limited by pain Patient left: in chair;with call bell in reach;with family/visitor present General Behavior During Session: [facility] for tasks performed Cognition: Impaired, at baseline  Rebeca Alert [facility] 04/24/2011, 2:09 PM [phone]

## 2011-04-24 NOTE — Progress Notes (Signed)
In and out cath completed using aseptic technique as per order after speaking with Dr. Laural Benes in regards to patient's frequent complaints of the need to void and PVR results. 475 ml of amber urine returned. Pt verbalized relief. Patient's husband also aware of the need for followup CT as per request of Dr. Laural Benes. Dominique Martin Dominique Martin

## 2011-04-24 NOTE — Progress Notes (Signed)
Patient set to discharge back to [facility] today. [facility] called for transportation.   Unice Bailey, LCSWA [phone]

## 2011-04-24 NOTE — Discharge Summary (Signed)
See Discharge summary dated 04/23/11:  Addendum:  1. Unexplained hypoxia: Patients oxygen saturation level on D/C vitals on 2/25 was 86%. No previous record of oxygen use. CT angio chest done on day 2/26 as follows:  No evidence of acute pulmonary embolism.  2. Small bilateral pleural effusions and basilar atelectasis with  somewhat prominent interstitial markings most suspicious for mild  interstitial edema.  3. 5 mm noncalcified nodule in the right upper lobe. Follow-up CT  chest in 6 months is recommended if the patient is considered high  Risk.  Plan: will give lasix IV. Pt will need follow up CT chest in 6 months. At time of discharge oxygen saturation 91% on room air.   Physical exam:  General: Alert, awake, oriented to self, in no acute distress.  HEENT: No bruits, no goiter. PERRL Mucus membranes moist/pink  Heart: Regular rate and rhythm, without murmurs, rubs, gallops.  Lungs:Normal effort. Breath sounds clear to auscultation bilaterally. No wheeze, rhonchi  Abdomen: Soft, nontender, nondistended, positive bowel sounds.  Extremities: No clubbing cyanosis or edema with positive pedal pulses.  Neuro: Grossly intact, nonfocal. Cranial nerve II-XII intact. MOE   Dispo: discharge to facility  Time spent 30 min

## 2011-04-24 NOTE — Discharge Instructions (Addendum)
Back, Compression Fracture °A compression fracture happens when a force is put upon the length of your spine. Slipping and falling on your bottom are examples of such a force. When this happens, sometimes the force is great enough to compress the building blocks (vertebral bodies) of your spine. Although this causes a lot of pain, this can usually be treated at home, unless your caregiver feels hospitalization is needed for pain control. °Your backbone (spinal column) is made up of 24 main vertebral bodies in addition to the sacrum and coccyx (see illustration). These are held together by tough fibrous tissues (ligaments) and by support of your muscles. Nerve roots pass through the openings between the vertebrae. A sudden wrenching move, injury, or a fall may cause a compression fracture of one of the vertebral bodies. This may result in back pain or spread of pain into the belly (abdomen), the buttocks, and down the leg into the foot. Pain may also be created by muscle spasm alone. °Large studies have been undertaken to determine the best possible course of action to help your back following injury and also to prevent future problems. The recommendations are as follows. °FOLLOWING A COMPRESSION FRACTURE: °Do the following only if advised by your caregiver.  °· If a back brace has been suggested or provided, wear it as directed.  °· DO NOT stop wearing the back brace unless instructed by your caregiver.  °· When allowed to return to regular activities, avoid a sedentary life style. Actively exercise. Sporadic weekend binges of tennis, racquetball, water skiing, may actually aggravate or create problems, especially if you are not in condition for that activity.  °· Avoid sports requiring sudden body movements until you are in condition for them. Swimming and walking are safer activities.  °· Maintain good posture.  °· Avoid obesity.  °· If not already done, you should have a DEXA scan. Based on the results, be  treated for osteoporosis.  °FOLLOWING ACUTE (SUDDEN) INJURY: °· Only take over-the-counter or prescription medicines for pain, discomfort, or fever as directed by your caregiver.  °· Use bed rest for only the most extreme acute episode. Prolonged bed rest may aggravate your condition. Ice used for acute conditions is effective. Use a large plastic bag filled with ice. Wrap it in a towel. This also provides excellent pain relief. This may be continuous. Or use it for 30 minutes every 2 hours during acute phase, then as needed. Heat for 30 minutes prior to activities is helpful.  °· As soon as the acute phase (the time when your back is too painful for you to do normal activities) is over, it is important to resume normal activities and work hardening programs. Back injuries can cause potentially marked changes in lifestyle. So it is important to attack these problems aggressively.  °· See your caregiver for continued problems. He or she can help or refer you for appropriate exercises, physical therapy and work hardening if needed.  °· If you are given narcotic medications for your condition, for the next 24 hours DO NOT:  °· Drive  °· Operate machinery or power tools.  °· Sign legal documents.  °· DO NOT drink alcohol, take sleeping pills or other medications that may interfere with treatment.  °If your caregiver has given you a follow-up appointment, it is very important to keep that appointment. Not keeping the appointment could result in a chronic or permanent injury, pain, and disability. If there is any problem keeping the appointment, you must call   back to this facility for assistance.  °SEEK IMMEDIATE MEDICAL CARE IF: °· You develop numbness, tingling, weakness, or problems with the use of your arms or legs.  °· You develop severe back pain not relieved with medications.  °· You have changes in bowel or bladder control.  °· You have increasing pain in any areas of the body.  °Document Released: 02/12/2005  Document Revised: 10/25/2010 Document Reviewed: 09/17/2007 °[employer]® Patient Information ©2012 [employer]. °

## 2011-04-25 NOTE — Discharge Summary (Signed)
Pt is alert and at her baseline of orientation today. Her Oxygen saturation is 96% on RA during Physical therapy.  Lungs: CTA  COR: S1, S2 normal. No M/R/G. ABD: soft, NT/ND no masses, no H/S/M.  EXT: no C/C/E.   A/P: Pt was scheduled for D/C yesterday but was noted to have hypoxemia at the time of D/C. D/C was held and CXR, and D-dimer were ordered. D-Dimer was elevated and CT angiogram was done and PE ruled out. Pt did have some small pleural effusions which were felt to be contributing to hypoxemia. Lasix was given and pt was D/C'ed to SNF with resolved hypoxemia and original D/C plans in place as pertaining to vertebral fracture.

## 2011-04-29 DIAGNOSIS — M8448XA Pathological fracture, other site, initial encounter for fracture: Secondary | ICD-10-CM

## 2011-04-29 DIAGNOSIS — G43001 Migraine without aura, not intractable, with status migrainosus: Secondary | ICD-10-CM

## 2011-04-29 HISTORY — DX: Pathological fracture, other site, initial encounter for fracture: M84.48XA

## 2011-04-29 HISTORY — DX: Migraine without aura, not intractable, with status migrainosus: G43.001

## 2011-05-08 DIAGNOSIS — K644 Residual hemorrhoidal skin tags: Secondary | ICD-10-CM

## 2011-05-08 HISTORY — DX: Residual hemorrhoidal skin tags: K64.4

## 2011-05-21 DIAGNOSIS — R079 Chest pain, unspecified: Secondary | ICD-10-CM

## 2011-05-21 HISTORY — DX: Chest pain, unspecified: R07.9

## 2011-07-03 DIAGNOSIS — R350 Frequency of micturition: Secondary | ICD-10-CM

## 2011-07-03 DIAGNOSIS — F411 Generalized anxiety disorder: Secondary | ICD-10-CM

## 2011-07-03 HISTORY — DX: Frequency of micturition: R35.0

## 2011-07-03 HISTORY — DX: Generalized anxiety disorder: F41.1

## 2011-08-14 DIAGNOSIS — K5730 Diverticulosis of large intestine without perforation or abscess without bleeding: Secondary | ICD-10-CM

## 2011-08-14 DIAGNOSIS — B029 Zoster without complications: Secondary | ICD-10-CM

## 2011-08-14 HISTORY — DX: Diverticulosis of large intestine without perforation or abscess without bleeding: K57.30

## 2011-08-14 HISTORY — DX: Zoster without complications: B02.9

## 2011-12-06 LAB — HEPATIC FUNCTION PANEL
AST: 17 U/L (ref 13–35)
Alkaline Phosphatase: 50 U/L (ref 25–125)
Bilirubin, Total: 0.4 mg/dL

## 2011-12-06 LAB — BASIC METABOLIC PANEL
BUN: 17 mg/dL (ref 4–21)
Potassium: 4.2 mmol/L (ref 3.4–5.3)
Sodium: 141 mmol/L (ref 137–147)

## 2012-05-13 ENCOUNTER — Non-Acute Institutional Stay (SKILLED_NURSING_FACILITY): Payer: Medicare Other | Admitting: Geriatric Medicine

## 2012-05-13 ENCOUNTER — Encounter: Payer: Self-pay | Admitting: Geriatric Medicine

## 2012-05-13 DIAGNOSIS — E785 Hyperlipidemia, unspecified: Secondary | ICD-10-CM

## 2012-05-13 DIAGNOSIS — I10 Essential (primary) hypertension: Secondary | ICD-10-CM

## 2012-05-13 DIAGNOSIS — F32A Depression, unspecified: Secondary | ICD-10-CM

## 2012-05-13 DIAGNOSIS — F0280 Dementia in other diseases classified elsewhere without behavioral disturbance: Secondary | ICD-10-CM

## 2012-05-13 DIAGNOSIS — R269 Unspecified abnormalities of gait and mobility: Secondary | ICD-10-CM

## 2012-05-13 DIAGNOSIS — K219 Gastro-esophageal reflux disease without esophagitis: Secondary | ICD-10-CM | POA: Insufficient documentation

## 2012-05-13 DIAGNOSIS — F411 Generalized anxiety disorder: Secondary | ICD-10-CM

## 2012-05-13 DIAGNOSIS — F329 Major depressive disorder, single episode, unspecified: Secondary | ICD-10-CM

## 2012-05-13 DIAGNOSIS — G309 Alzheimer's disease, unspecified: Secondary | ICD-10-CM | POA: Insufficient documentation

## 2012-05-13 NOTE — Assessment & Plan Note (Signed)
MDS review: BIMS score 9/15, PHQ-9 0/27. Functional status: Patient requires limited assist with ambulate emulation, extensive assist with dressing and toileting, continues to eat independently. Patient participates in unit activities, continues to be appropriate for Memory Care setting. Spouse continues to visit frequently. Continue current medications.

## 2012-05-13 NOTE — Assessment & Plan Note (Signed)
Patient continues on low-dose statin. Last lipid panel 11/2012, repeat 05/2012.

## 2012-05-13 NOTE — Assessment & Plan Note (Addendum)
Patient's appetite and weight are stable, she is participating with the unit activities. Continues to have episodes of anxiety. Check labs 05/2012

## 2012-05-13 NOTE — Assessment & Plan Note (Signed)
Recent BP range 127-150/71-79, continue medication. Last lab satisfactory (11/2011), repeat CMP 05/2012.

## 2012-05-13 NOTE — Assessment & Plan Note (Signed)
Pt. continues to have unsteady gait, often cannot remember she needs supervision/ assistance of walker, often insists she is able to walk withtout assistance. Last fall 03/11/12, no injury.

## 2012-05-13 NOTE — Progress Notes (Signed)
Subjective:     Patient ID: Dominique Martin, female   DOB: Apr 11, 1926, 79 y.o.   MRN: [id number]  HPI  This is a 79 y.o. female resident of [facility], Memory Care section evaluated today for management of ongoing medical issues.  Review of record shows no acute issues..  Review of Systems    REVIEW of SYSTEMS:   DATA OBTAINED: from patient, nurse, medical record, GENERAL: Feels well. No fevers, fatigue, change in activity status. No change in appetite, weight or sleep. SKIN: No itch, rash or open wounds EYES: No eye pain, dryness or itching. No change in vision EARS: No earache,  NOSE: No congestion, drainage or bleeding MOUTH/THROAT: No mouth or tooth pain. No difficulty chewing or swallowing. RESPIRATORY: No cough, wheezing, SOB CARDIAC: No chest pain, palpitations. No edema. GI: No abdominal pain. No N/V/D or constipation. No heartburn or reflux. Occ incontinence GU: No dysuria, frequency or urgency.   No change in urine volume or character. Occ incontinence MUSCULOSKELETAL: No joint pain, swelling or stiffness. No back pain. No muscle ache, pain, weakness. Gait is unsteady. No recent falls.  NEUROLOGIC: No dizziness, fainting, headache, imbalance, numbness. No change in mental status.  PSYCHIATRIC: No anxiety, depression, behavior issue. Sleeps well.   Objective:   Physical Exam     Filed Weights   05/13/12 1026  Weight: 133 lb (60.328 kg)    Filed Vitals:   05/13/12 1026  BP: 147/89  Pulse: 79  Temp: 97.8 F (36.6 C)  Resp: 20   PHYSICAL EXAM:   GENERAL APPEARANCE: No acute distress, appropriately groomed, normal body habitus. Alert, pleasant, conversant. HEAD: Normocephalic, atraumatic EYES: Conjunctiva/lids clear. Pupils round, reactive. EOMs intact.  NOSE: No deformity or discharge. MOUTH/THROAT: Lips w/o lesions. Oral mucosa, tongue moist, w/o lesion. Oropharynx w/o redness or lesions.  NECK: Supple, full ROM.  LYMPHATICS: No head, neck  or supraclavicular adenopathy RESPIRATORY: Breathing is even, unlabored. Lung sounds are clear and full.  CARDIOVASCULAR: Heart RRR. No murmur or extra heart sounds  VENOUS: No varicosities. No venous stasis skin changes  EDEMA: No peripheral or periorbital edema. No ascites GASTROINTESTINAL: Abdomen is soft, non-tender, not distended w/ normal bowel sounds.  MUSCULOSKELETAL: Moves all extremities with full ROM, strength and tone. Back is without kyphosis, scoliosis or spinal process tenderness.   NEUROLOGIC: Not Oriented to time, place,  . Cranial nerves 2-12 grossly intact, speech clear, Tremor present.  PSYCHIATRIC: Mood and affect appropriate to situation  Assessment:    Hypertension Recent BP range 127-150/71-79, continue medication. Last lab satisfactory (11/2011), repeat CMP 05/2012.  Alzheimer's disease MDS review: BIMS score 9/15, PHQ-9 0/27. Functional status: Patient requires limited assist with ambulate emulation, extensive assist with dressing and toileting, continues to eat independently. Patient participates in unit activities, continues to be appropriate for Memory Care setting. Spouse continues to visit frequently. Continue current medications.  Generalized anxiety disorder Sertraline dose was increased to 100 mg daily January 2014. Review of records shows patient continues to have episodes of increased anxiety and agitation.  Staff is often is successful with one-to-one interventions, does sometimes require use of p.r.n. Ativan.  Depression Patient's appetite and weight are stable, she is participating with the unit activities. Continues to have episodes of anxiety. Check labs 05/2012  Gait disorder Pt. continues to have unsteady gait, often cannot remember she needs supervision/ assistance of walker, often insists she is able to walk withtout assistance. Last fall 03/11/12, no injury.   HYPERLIPIDEMIA Patient continues on low-dose  statin. Last lipid panel 11/2012, repeat  05/2012.        Plan:    Continue current medications. Lab 4 2014: CBC, CMP, TSH, lipid panel.

## 2012-05-13 NOTE — Assessment & Plan Note (Signed)
Sertraline dose was increased to 100 mg daily January 2014. Review of records shows patient continues to have episodes of increased anxiety and agitation.  Staff is often is successful with one-to-one interventions, does sometimes require use of p.r.n. Ativan.

## 2012-05-15 ENCOUNTER — Other Ambulatory Visit: Payer: Self-pay | Admitting: *Deleted

## 2012-05-15 MED ORDER — LORAZEPAM 0.5 MG PO TABS: ORAL_TABLET | ORAL | Status: AC

## 2012-05-28 LAB — LIPID PANEL
CHOLESTEROL: 128 mg/dL (ref 0–200)
HDL: 46 mg/dL (ref 35–70)
LDL Cholesterol: 86 mg/dL

## 2012-06-23 IMAGING — CR DG CHEST 2V
2 series · 2 of 2 positions shown · non-contrast
Comparison: 07/09/2006 and 05/19/2004.

CLINICAL DATA: Hypoxemia and shortness of breath.

CHEST - 2 VIEW

[w chest lat]
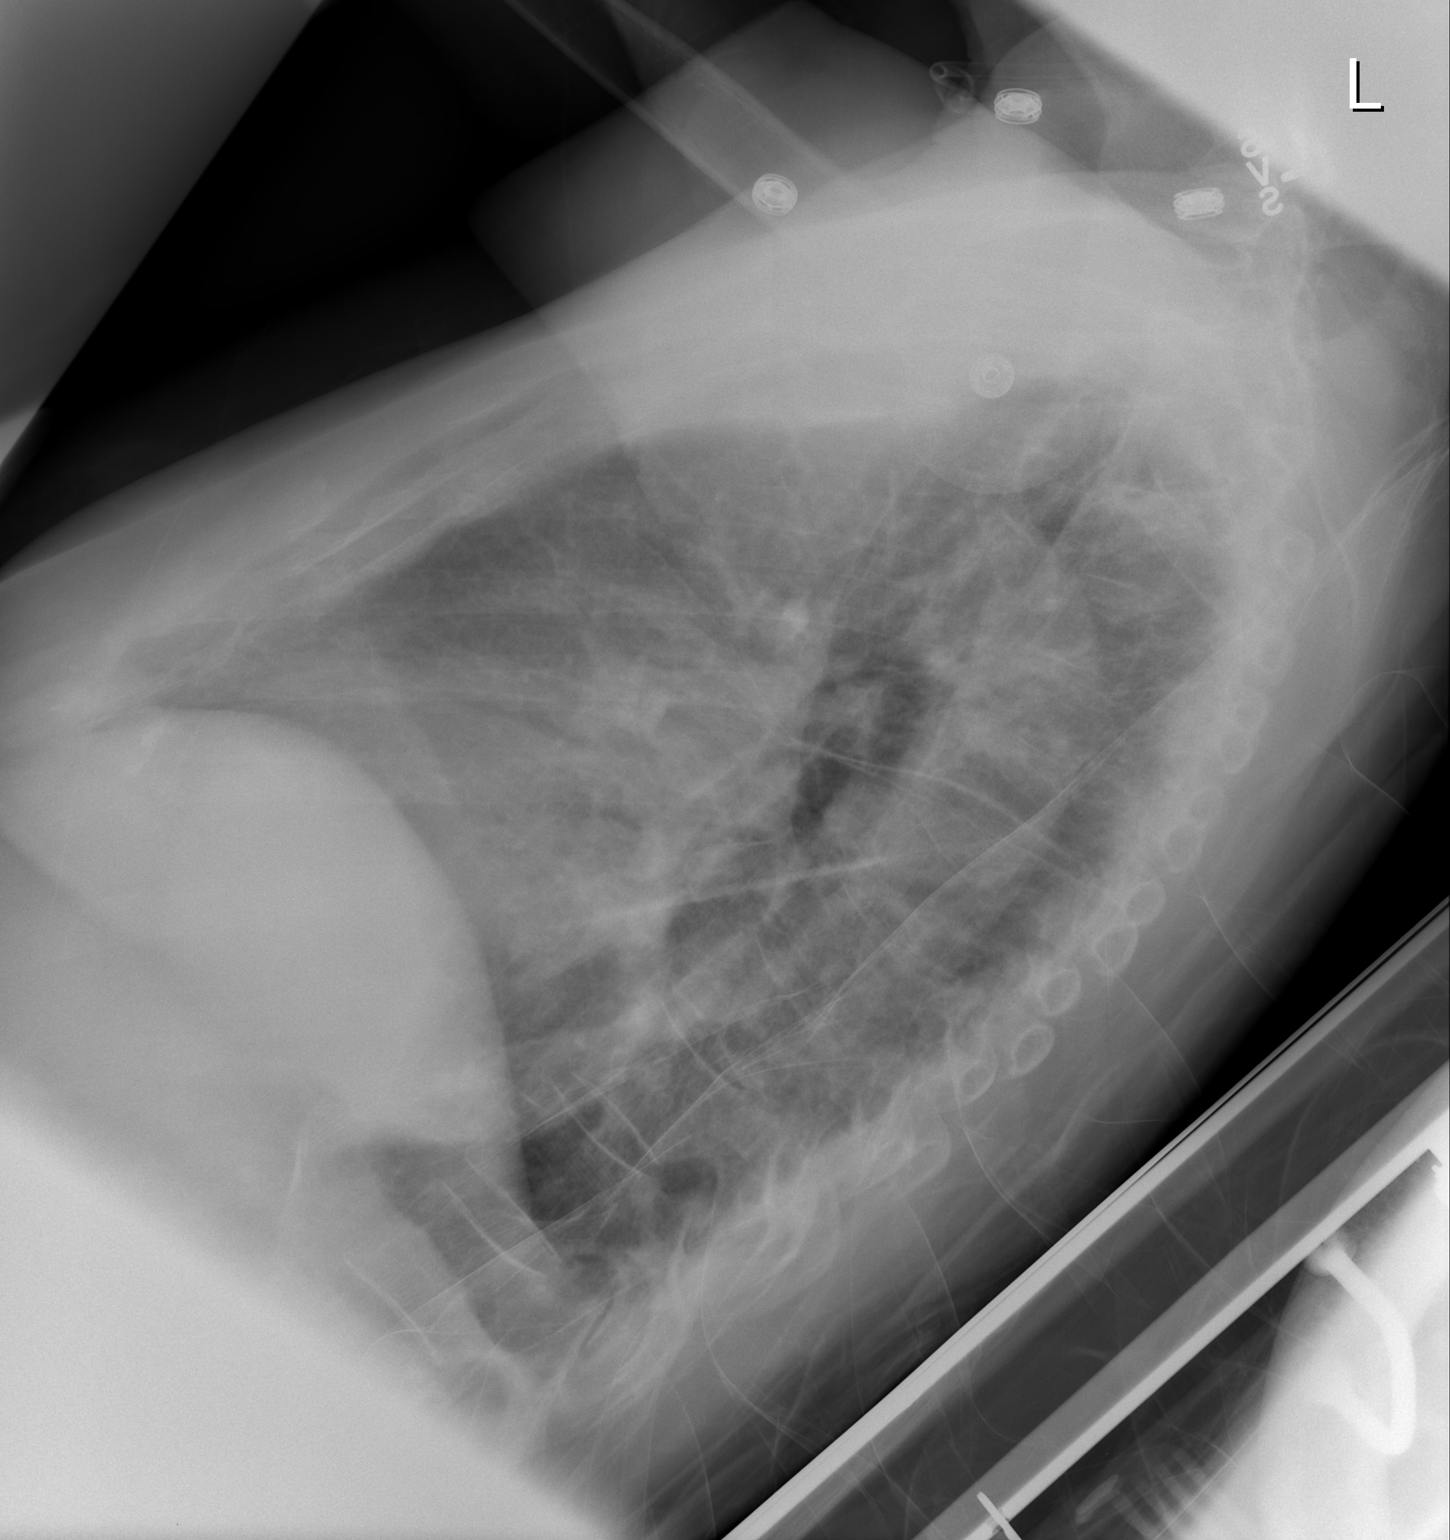

[view not recorded]
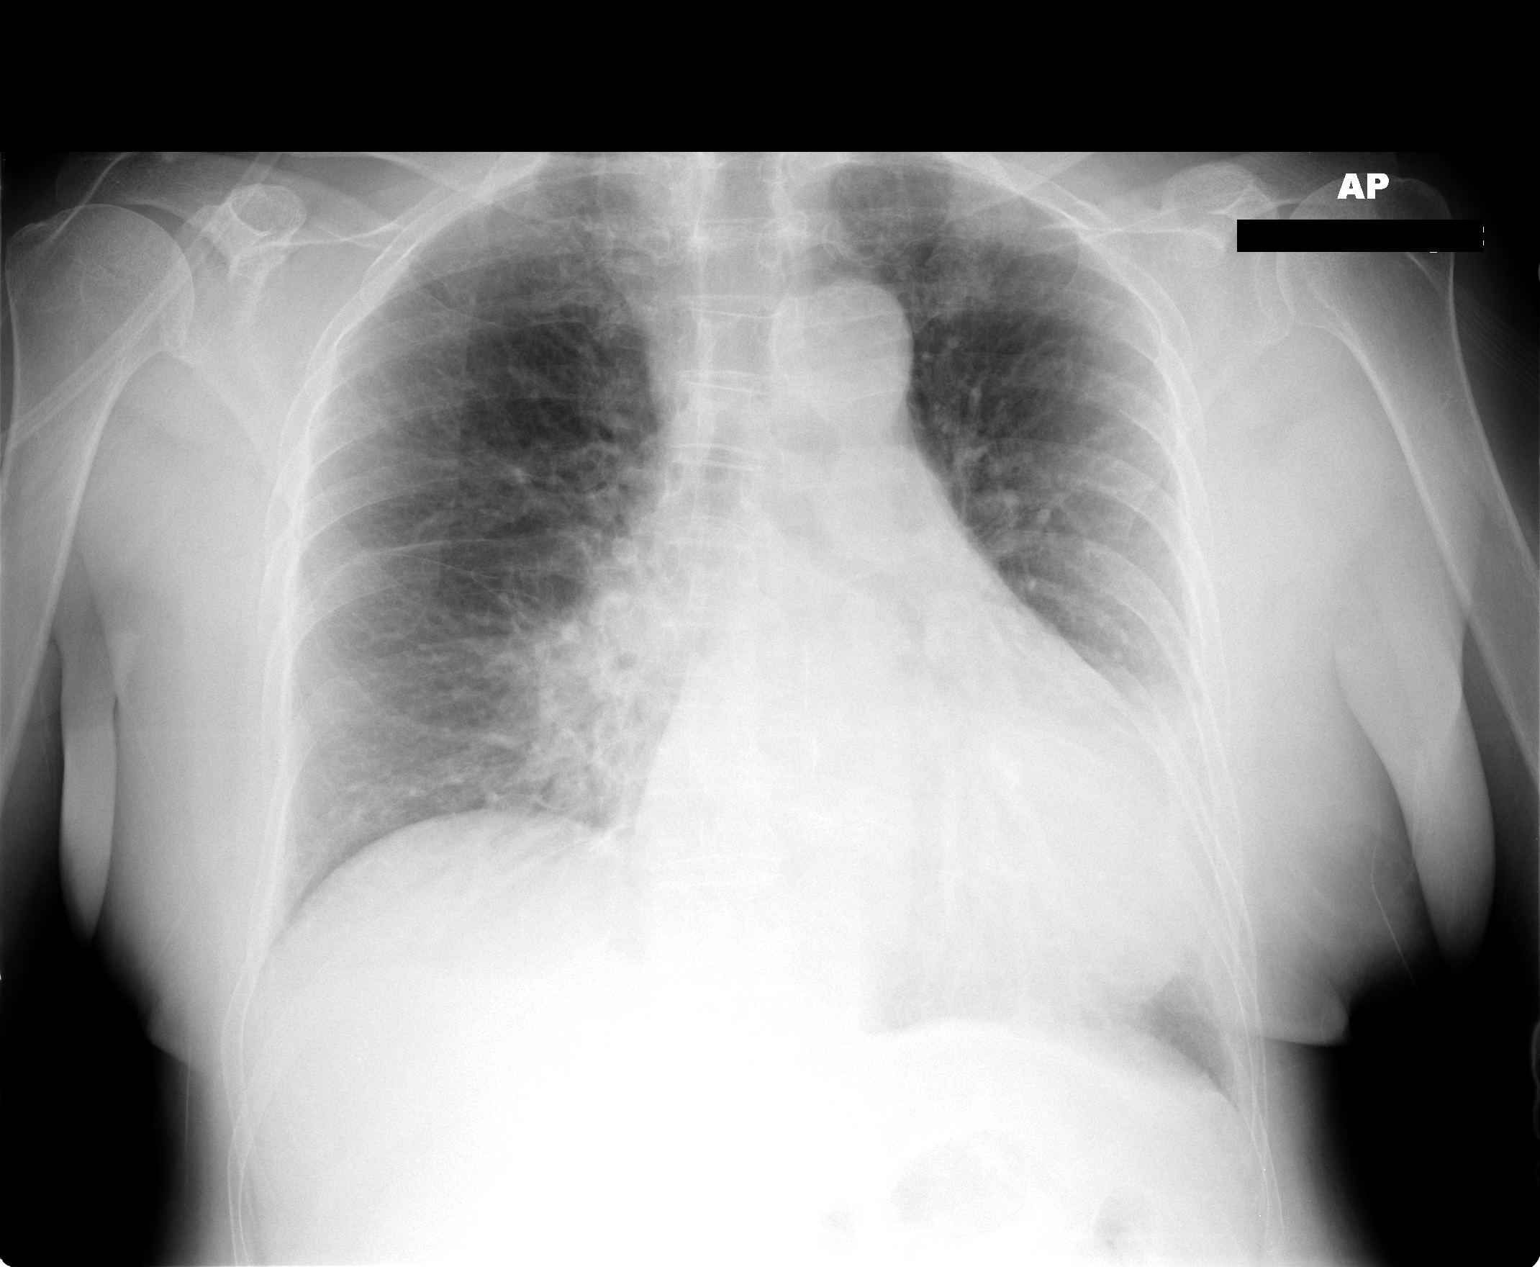

[2 of 2 positions shown; findings below may reference images not displayed]

FINDINGS: Two views of the chest were obtained.  There is concern
for mediastinal shift towards the left.  There is increased soft
tissue in the right infrahilar region compared to the prior
examinations.  The trachea is midline.  Upper lungs are clear.
There is new elevation of the right hemidiaphragm.
IMPRESSION: Concern for increased soft tissue in the right infrahilar region
and mediastinal shift towards the left.  Recommend a chest CT with
IV contrast to further evaluate the right infrahilar density.

These results were called by telephone on 04/23/2011  at  [DATE] p.m.
to  Dr. Kayy, who verbally acknowledged these results.

## 2012-07-08 ENCOUNTER — Non-Acute Institutional Stay: Payer: Medicare Other | Admitting: Geriatric Medicine

## 2012-07-08 DIAGNOSIS — E785 Hyperlipidemia, unspecified: Secondary | ICD-10-CM

## 2012-07-08 DIAGNOSIS — I10 Essential (primary) hypertension: Secondary | ICD-10-CM

## 2012-07-08 DIAGNOSIS — I499 Cardiac arrhythmia, unspecified: Secondary | ICD-10-CM | POA: Insufficient documentation

## 2012-07-08 DIAGNOSIS — R269 Unspecified abnormalities of gait and mobility: Secondary | ICD-10-CM

## 2012-07-08 DIAGNOSIS — F0280 Dementia in other diseases classified elsewhere without behavioral disturbance: Secondary | ICD-10-CM

## 2012-07-08 DIAGNOSIS — F411 Generalized anxiety disorder: Secondary | ICD-10-CM

## 2012-07-08 DIAGNOSIS — G309 Alzheimer's disease, unspecified: Secondary | ICD-10-CM

## 2012-07-17 NOTE — Assessment & Plan Note (Signed)
Continues to have episodes of increased anxiety, Staff is usually able to diffuse this with one-to-one interventions.

## 2012-07-17 NOTE — Assessment & Plan Note (Signed)
Most recent lipid panel reveals low HDL. Atorvastatin  was stopped

## 2012-07-17 NOTE — Assessment & Plan Note (Signed)
The patient continues to insist she can ambulate independently. She does ambulate fairly well with a walker but remains very unsteady. Continue close supervision of ambulation.

## 2012-07-17 NOTE — Assessment & Plan Note (Signed)
Irregular heart rhythm noted on exam today. EKG showed sinus rhythm 80 beats per minute. This is unchanged from previous exam April 2013. No further intervention required at this time, recommend monitor heart rate and rhythm when patient's anxiety is increased.

## 2012-07-17 NOTE — Assessment & Plan Note (Signed)
Recent blood pressure range 104 138/76-88. Pulse 77 and 99. Recent lab satisfactory. Continue current medication

## 2012-07-17 NOTE — Progress Notes (Signed)
Patient ID: Dominique Martin, female   DOB: 1926-12-12, 79 y.o.   MRN: [id number] [facility] (13)Memory Care  Chief Complaint  Patient presents with  . Medical Managment of Chronic Issues    AD, gait disorder, anxiety, HTN    HPI: This is a 79 y.o. female resident of [facility],  Assisted Living, Memory Care section evaluated today for management of ongoing medical issues.  Review of record shows patient's vital signs and weight have been stable, she continues to eat adequately and sleeps well at night. No significant change in her cognitive or functional status. Patient does continue to have episodes of increased anxiety as well as frustration with not being able to ambulate independently.   Functional Status:  Bathing: Maximal Assist,  Bladder Management: Occasional incontinence , Bowel Management: Occasional incontinence,  Feeding: Supervision,  Toileting / Clothing: Maximal Assist,  Walk: Stand by Assist/Walker  Allergies  Allergies  Allergen Reactions  . Aricept (Donepezil Hydrochloride)   . Penicillins    Medications Reviewed  Data Reviewed       BJY:NWGNFAO, external 06/05/11 Glu 104, BUN 10, Cr..65, Na 139, K+ 3.8. Protein/LFTS WNL TC 129, TG 86, HDL 59, LDL 53 TSH .992 Vit B12 329 U/A , micro- negative for infection 08/16/11 Glu 91, BUN 13, Cr. .62, Na 139, K+ 4.3 10/10 13 Glu 85, BUN 17, cr. .81, Na 141, K+ 4.2. Protein/LFTs WNL TC 161, TG 142, HDL 42, LDL 91  05/27/2012: WBC 7.9, hemoglobin 13.3, hematocrit 39.9, platelets 208  Glucose 76, BUN 17, creatinine 0.77, sodium 140, potassium 4.1. Protein/LFTs WNL  Total cholesterol 158, triglyceride 128,  HDL 46, LDL 86  TSH 2.7 to    Review of Systems  DATA OBTAINED: from patient, nurse, medical record, GENERAL: Feels well No fevers, fatigue, change in appetite or weight SKIN: No itch, rash or open wounds EYES: No eye pain, dryness or itching  No change in vision EARS: No  earache,  change in hearing NOSE: No congestion, drainage or bleeding MOUTH/THROAT: No mouth, tooth pain or sore throat  No difficulty chewing or swallowing RESPIRATORY: No cough, wheezing, SOB CARDIAC: No chest pain,  No edema. GI: No abdominal pain  No N/V/D or constipation  No heartburn or reflux  GU: No dysuria. Urinary frequency present (not new)  No change in urine volume or character  MUSCULOSKELETAL: No joint pain, swelling or stiffness  No back pain  No muscle ache, pain, weakness  Gait is unsteady   NEUROLOGIC: No dizziness, fainting, headache  No change in mental status.  PSYCHIATRIC: No feelings of anxiety, depression Sleeps well.  No behavior issue.    Physical Exam Filed Vitals:   07/08/12 1946  BP: 124/77  Pulse: 99  Temp: 97.5 F (36.4 C)  Weight: 139 lb 8 oz (63.277 kg)   Body mass index is 23.93 kg/(m^2).  GENERAL APPEARANCE: No acute distress, appropriately groomed, normal body habitus. Alert, pleasant, conversant. HEAD: Normocephalic, atraumatic EYES: Conjunctiva/lids clear. Pupils round, reactive. EARS: External exam WNL, decreased hearing     NOSE: No deformity or discharge. MOUTH/THROAT: Lips w/o lesions. Oral mucosa, tongue moist, w/o lesion. Oropharynx w/o redness or lesions.  NECK: Supple, full ROM. No thyroid tenderness, enlargement or nodule LYMPHATICS: No head, neck or supraclavicular adenopathy RESPIRATORY: Breathing is even, unlabored. Lung sounds are clear and full.  CARDIOVASCULAR: Heart IRRR(new). No murmur or extra heart sounds      ARTERIAL: No carotid bruit.   EDEMA: No peripheral edema.  GASTROINTESTINAL: Abdomen is soft, non-tender, not distended w/ normal bowel sounds. MUSCULOSKELETAL: Moves all extremities with full ROM, strength and tone. Back is without kyphosis, scoliosis or spinal process tenderness.  NEUROLOGIC: Not Oriented to time, place. Speech clear, no tremor. Marland Kitchen PSYCHIATRIC: Mood and affect appropriate to  situation  ASSESSMENT/PLAN  Alzheimer's disease Unchanged since last visit. Continues to benefit from memory Care environment. Continue current medication  Gait disorder The patient continues to insist she can ambulate independently. She does ambulate fairly well with a walker but remains very unsteady. Continue close supervision of ambulation.  Generalized anxiety disorder Continues to have episodes of increased anxiety, Staff is usually able to diffuse this with one-to-one interventions.  Hypertension Recent blood pressure range 104 138/76-88. Pulse 77 and 99. Recent lab satisfactory. Continue current medication  HYPERLIPIDEMIA Most recent lipid panel reveals low HDL. Atorvastatin  was stopped  Irregular heart rhythm Irregular heart rhythm noted on exam today. EKG showed sinus rhythm 80 beats per minute. This is unchanged from previous exam April 2013. No further intervention required at this time, recommend monitor heart rate and rhythm when patient's anxiety is increased.   >25 minutes spent with pt. , chart, EKG  Follow up: Routine or as needed.  Toribio Harbour, NP-C 07/08/2012

## 2012-07-17 NOTE — Assessment & Plan Note (Signed)
Unchanged since last visit. Continues to benefit from memory Care environment. Continue current medication

## 2012-07-19 ENCOUNTER — Emergency Department (HOSPITAL_COMMUNITY): Payer: Medicare Other

## 2012-07-19 ENCOUNTER — Emergency Department (HOSPITAL_COMMUNITY)
Admission: EM | Admit: 2012-07-19 | Discharge: 2012-07-19 | Disposition: A | Discharge status: Skilled Nursing Facility | Payer: Medicare Other | Attending: Emergency Medicine | Admitting: Emergency Medicine

## 2012-07-19 ENCOUNTER — Encounter (HOSPITAL_COMMUNITY): Payer: Self-pay | Admitting: *Deleted

## 2012-07-19 DIAGNOSIS — M810 Age-related osteoporosis without current pathological fracture: Secondary | ICD-10-CM | POA: Insufficient documentation

## 2012-07-19 DIAGNOSIS — Y939 Activity, unspecified: Secondary | ICD-10-CM | POA: Insufficient documentation

## 2012-07-19 DIAGNOSIS — Z8709 Personal history of other diseases of the respiratory system: Secondary | ICD-10-CM | POA: Insufficient documentation

## 2012-07-19 DIAGNOSIS — F329 Major depressive disorder, single episode, unspecified: Secondary | ICD-10-CM | POA: Insufficient documentation

## 2012-07-19 DIAGNOSIS — Y9210 Unspecified residential institution as the place of occurrence of the external cause: Secondary | ICD-10-CM | POA: Insufficient documentation

## 2012-07-19 DIAGNOSIS — G43909 Migraine, unspecified, not intractable, without status migrainosus: Secondary | ICD-10-CM | POA: Insufficient documentation

## 2012-07-19 DIAGNOSIS — K219 Gastro-esophageal reflux disease without esophagitis: Secondary | ICD-10-CM | POA: Insufficient documentation

## 2012-07-19 DIAGNOSIS — Z880 Allergy status to penicillin: Secondary | ICD-10-CM | POA: Insufficient documentation

## 2012-07-19 DIAGNOSIS — S0993XA Unspecified injury of face, initial encounter: Secondary | ICD-10-CM | POA: Insufficient documentation

## 2012-07-19 DIAGNOSIS — Z8639 Personal history of other endocrine, nutritional and metabolic disease: Secondary | ICD-10-CM | POA: Insufficient documentation

## 2012-07-19 DIAGNOSIS — F3289 Other specified depressive episodes: Secondary | ICD-10-CM | POA: Insufficient documentation

## 2012-07-19 DIAGNOSIS — G309 Alzheimer's disease, unspecified: Secondary | ICD-10-CM | POA: Insufficient documentation

## 2012-07-19 DIAGNOSIS — Z8669 Personal history of other diseases of the nervous system and sense organs: Secondary | ICD-10-CM | POA: Insufficient documentation

## 2012-07-19 DIAGNOSIS — Z87891 Personal history of nicotine dependence: Secondary | ICD-10-CM | POA: Insufficient documentation

## 2012-07-19 DIAGNOSIS — F0280 Dementia in other diseases classified elsewhere without behavioral disturbance: Secondary | ICD-10-CM | POA: Insufficient documentation

## 2012-07-19 DIAGNOSIS — R296 Repeated falls: Secondary | ICD-10-CM | POA: Insufficient documentation

## 2012-07-19 DIAGNOSIS — Z8619 Personal history of other infectious and parasitic diseases: Secondary | ICD-10-CM | POA: Insufficient documentation

## 2012-07-19 DIAGNOSIS — E559 Vitamin D deficiency, unspecified: Secondary | ICD-10-CM | POA: Insufficient documentation

## 2012-07-19 DIAGNOSIS — I10 Essential (primary) hypertension: Secondary | ICD-10-CM | POA: Insufficient documentation

## 2012-07-19 DIAGNOSIS — Z794 Long term (current) use of insulin: Secondary | ICD-10-CM | POA: Insufficient documentation

## 2012-07-19 DIAGNOSIS — Z8719 Personal history of other diseases of the digestive system: Secondary | ICD-10-CM | POA: Insufficient documentation

## 2012-07-19 DIAGNOSIS — Z862 Personal history of diseases of the blood and blood-forming organs and certain disorders involving the immune mechanism: Secondary | ICD-10-CM | POA: Insufficient documentation

## 2012-07-19 DIAGNOSIS — Z8781 Personal history of (healed) traumatic fracture: Secondary | ICD-10-CM | POA: Insufficient documentation

## 2012-07-19 DIAGNOSIS — W19XXXA Unspecified fall, initial encounter: Secondary | ICD-10-CM

## 2012-07-19 NOTE — ED Notes (Signed)
Per EMS report pt coming from [facility] memory care unit with c/o fall. Per EMS staff witnessed pt fall from the stranding position, this morning after being startled by staff members coming in her room. Pt c/o neck pain, not on blood thinners. Per EMS no obvious injuries.

## 2012-07-19 NOTE — ED Notes (Signed)
WUJ:WJ19<JY> Expected date:07/19/12<BR> Expected time: 9:08 AM<BR> Means of arrival:Ambulance<BR> Comments:<BR> fall

## 2012-07-19 NOTE — ED Provider Notes (Signed)
History     CSN: [id number]  Arrival date & time 07/19/12  3086   First MD Initiated Contact with Patient 07/19/12 [phone]      Chief Complaint  Patient presents with  . Fall    (Consider location/radiation/quality/duration/timing/severity/associated sxs/prior treatment) Patient is a 79 y.o. female presenting with fall. The history is provided by the EMS personnel (pt fell today and it was witnessed by staff).  Fall This is a new problem. The current episode started 3 to 5 hours ago. The problem occurs rarely. The problem has been gradually improving. Pertinent negatives include no chest pain, no abdominal pain and no headaches. Nothing aggravates the symptoms. Nothing relieves the symptoms.    Past Medical History  Diagnosis Date  . Vitamin D deficiency   . Hypercholesterolemia   . Alzheimer disease 2013  . Migraine   . Depression 2013  . BPPV (benign paroxysmal positional vertigo)   . Diverticulitis 2003    Also ischemic colitis  . GERD (gastroesophageal reflux disease)   . Tremor, essential 2004  . Gait disorder   . Herpes zoster 2006  . Hypertension   . Osteoporosis     tx w/ bisphosphonate 2007-2013  . Pulmonary nodule/lesion, solitary 03/2011    5mm RUL nodule  . Vertebral compression fracture 03/2011    L1  . Vitamin D deficiency     Past Surgical History  Procedure Laterality Date  . Bilateral salpingoophorectomy    . Carpal tunnel release      History reviewed. No pertinent family history.  History  Substance Use Topics  . Smoking status: Former Smoker    Quit date: 04/20/1980  . Smokeless tobacco: Not on file  . Alcohol Use: No    OB History   Grav Para Term Preterm Abortions TAB SAB Ect Mult Living                  Review of Systems  Constitutional: Negative for appetite change and fatigue.  HENT: Negative for congestion, sinus pressure and ear discharge.   Eyes: Negative for discharge.  Respiratory: Negative for cough.   Cardiovascular:  Negative for chest pain.  Gastrointestinal: Negative for abdominal pain and diarrhea.  Genitourinary: Negative for frequency and hematuria.  Musculoskeletal: Negative for back pain.  Skin: Negative for rash.  Neurological: Negative for seizures and headaches.  Psychiatric/Behavioral: Negative for hallucinations.    Allergies  Aricept; Penicillins; and Tubersol  Home Medications   Current Outpatient Rx  Name  Route  Sig  Dispense  Refill  . acetaminophen (TYLENOL) 500 MG tablet   Oral   Take 1,000 mg by mouth 2 (two) times daily. For pain         . amLODipine (NORVASC) 2.5 MG tablet   Oral   Take 2.5 mg by mouth daily.         . Calcium Carbonate-Vitamin D (CALCIUM 600+D3) 600-400 MG-UNIT per tablet   Oral   Take 1 tablet by mouth 2 (two) times daily.         . Carboxymethylcellulose Sodium 0.25 % SOLN   Ophthalmic   Apply 1 drop to eye 2 (two) times daily.         Marland Kitchen docusate sodium (COLACE) 100 MG capsule   Oral   Take 100 mg by mouth daily.          Marland Kitchen LORazepam (ATIVAN) 0.5 MG tablet      Take one tablet every 8 hours as needed for agitation  90 tablet   5   . Memantine HCl ER (NAMENDA XR) 28 MG CP24   Oral   Take 1 capsule by mouth daily.         . mirtazapine (REMERON) 7.5 MG tablet   Oral   Take 7.5 mg by mouth at bedtime.         Marland Kitchen omeprazole (PRILOSEC) 20 MG capsule   Oral   Take 20 mg by mouth daily.         Marland Kitchen oxybutynin (DITROPAN-XL) 5 MG 24 hr tablet   Oral   Take 5 mg by mouth daily.         . phenylephrine-shark liver oil-mineral oil-petrolatum (PREPARATION H) 0.25-3-14-71.9 % rectal ointment   Rectal   Place 1 application rectally 2 (two) times daily as needed for hemorrhoids. Apply to just inside in the anus after each bowl movement         . senna (SENOKOT) 8.6 MG TABS   Oral   Take 1 tablet by mouth at bedtime as needed (for no BM x 2 days).         . sertraline (ZOLOFT) 100 MG tablet   Oral   Take 100 mg by  mouth at bedtime.         . Vitamin D, Ergocalciferol, (DRISDOL) 50000 UNITS CAPS   Oral   Take 50,000 Units by mouth every 7 (seven) days. On Friday           BP 169/88  Pulse 105  Temp(Src) 98.4 F (36.9 C)  Resp 18  SpO2 98%  Physical Exam  Constitutional: She appears well-developed.  HENT:  Head: Normocephalic.  Eyes: Conjunctivae and EOM are normal. No scleral icterus.  Neck: Neck supple. No thyromegaly present.  Cardiovascular: Normal rate and regular rhythm.  Exam reveals no gallop and no friction rub.   No murmur heard. Pulmonary/Chest: No stridor. She has no wheezes. She has no rales. She exhibits no tenderness.  Abdominal: She exhibits no distension. There is no tenderness. There is no rebound.  Musculoskeletal: Normal range of motion. She exhibits no edema.  Lymphadenopathy:    She has no cervical adenopathy.  Neurological: She is alert. Coordination normal.  Pt demented and only oriented to person  Skin: No rash noted. No erythema.  Psychiatric: She has a normal mood and affect. Her behavior is normal.    ED Course  Procedures (including critical care time)  Labs Reviewed - No data to display Dg Pelvis 1-2 Views  07/19/2012   *RADIOLOGY REPORT*  Clinical Data: Fall.  Bilateral hip pain.  PELVIS - 1-2 VIEW  Comparison: Bone window images from CT pelvis 06/11/2006.  Findings: No evidence of acute or subacute fracture.  Joint spaces in both hips well preserved.  Sacroiliac joints and symphysis pubis intact.  No intrinsic osseous abnormalities.  Visualized lower lumbar spine unremarkable.  IMPRESSION: Normal examination.   Original Report Authenticated By: Hulan Saas, M.D.   Ct Head Wo Contrast  07/19/2012   *RADIOLOGY REPORT*  Clinical Data:  Fall.  Witnessed fall from standing position.  Neck pain.  From [facility] unit.  CT HEAD WITHOUT CONTRAST CT CERVICAL SPINE WITHOUT CONTRAST  Technique:  Multidetector CT imaging of the head and cervical  spine was performed following the standard protocol without intravenous contrast.  Multiplanar CT image reconstructions of the cervical spine were also generated.  Comparison:  04/21/2011  CT HEAD  Findings: There is significant central and cortical atrophy. Periventricular  white matter changes are consistent with small vessel disease.  There is atherosclerotic calcification of the internal carotid arteries. There is no evidence for hemorrhage, mass lesion, or acute infarction.  Bone windows shows significant mucoperiosteal thickening of the ethmoid, maxillary, and frontal air cells.  No calvarial fracture.  IMPRESSION:  1. Significant central cortical atrophy. 2. No evidence for acute intracranial abnormality. 3.  Chronic sinusitis.  CT CERVICAL SPINE  Findings: There is significant degenerative changes, especially throughout the mid cervical spine.  These are most notable at to C3- 4, C4-5, and C5-6.  There is no evidence for acute fracture or subluxation.  No suspicious lytic or blastic lesions are identified.  Visualized portion of the lung apices is unremarkable.  IMPRESSION:  1.  Significant degenerative changes. 2. No evidence for acute  abnormality.   Original Report Authenticated By: Norva Pavlov, M.D.   Ct Cervical Spine Wo Contrast  07/19/2012   *RADIOLOGY REPORT*  Clinical Data:  Fall.  Witnessed fall from standing position.  Neck pain.  From [facility] unit.  CT HEAD WITHOUT CONTRAST CT CERVICAL SPINE WITHOUT CONTRAST  Technique:  Multidetector CT imaging of the head and cervical spine was performed following the standard protocol without intravenous contrast.  Multiplanar CT image reconstructions of the cervical spine were also generated.  Comparison:  04/21/2011  CT HEAD  Findings: There is significant central and cortical atrophy. Periventricular white matter changes are consistent with small vessel disease.  There is atherosclerotic calcification of the internal carotid arteries.  There is no evidence for hemorrhage, mass lesion, or acute infarction.  Bone windows shows significant mucoperiosteal thickening of the ethmoid, maxillary, and frontal air cells.  No calvarial fracture.  IMPRESSION:  1. Significant central cortical atrophy. 2. No evidence for acute intracranial abnormality. 3.  Chronic sinusitis.  CT CERVICAL SPINE  Findings: There is significant degenerative changes, especially throughout the mid cervical spine.  These are most notable at to C3- 4, C4-5, and C5-6.  There is no evidence for acute fracture or subluxation.  No suspicious lytic or blastic lesions are identified.  Visualized portion of the lung apices is unremarkable.  IMPRESSION:  1.  Significant degenerative changes. 2. No evidence for acute  abnormality.   Original Report Authenticated By: Norva Pavlov, M.D.     1. Fall, initial encounter       MDM          Benny Lennert, MD 07/19/12 1027

## 2012-07-22 ENCOUNTER — Non-Acute Institutional Stay: Payer: Medicare Other | Admitting: Geriatric Medicine

## 2012-07-22 DIAGNOSIS — I499 Cardiac arrhythmia, unspecified: Secondary | ICD-10-CM

## 2012-07-22 DIAGNOSIS — R269 Unspecified abnormalities of gait and mobility: Secondary | ICD-10-CM

## 2012-07-22 MED ORDER — METOPROLOL TARTRATE 12.5 MG HALF TABLET: 12.5000 mg | ORAL_TABLET | Freq: Two times a day (BID) | ORAL | Status: DC

## 2012-07-22 NOTE — Progress Notes (Signed)
Patient ID: Dominique Martin, female   DOB: 1926-10-27, 79 y.o.   MRN: [id number] [facility] [phone])  Chief Complaint  Patient presents with  . Follow-up    Fall w/ back pain    HPI: This is a 79 y.o. female resident of [facility], Memory Care  Assisted Living section.  Evaluation today in follow up of fall 07/19/12.  Nurse reports pt. Was standing in her room, turned to look ar CNA and fell down on her right side. Pt. C/o of immediate back pain, worse with movement of legs. OnCall provider was notified, ED evaluation was recommended. CT scan of the head neck and pelvis negative for fractures or other acute abnormalities. Patient returned to [facility] with no new orders. Since returning patient has not complained of any pain, has continued to try to get up frequently without assistance. Review of record shows patient's heart rate range 69-96.   Allergies  Allergies  Allergen Reactions  . Aricept (Donepezil Hydrochloride)   . Penicillins   . Tubersol (Tuberculin Ppd)    Medications  Reviewed  Data Reviewed   Radiologic exams:    07/19/2012: CT scan of head, cervical neck. X-rays bilateral hips    JJO:ACZYSAY, external 05/27/2012: WBC 7.9, hemoglobin 13.3, hematocrit 39.9, platelets 208   Glucose 76, BUN 17, creatinine 0.77, sodium 140, potassium 4.1. Protein/LFTs WNL   Total cholesterol 158, triglyceride 128, HDL 46, LDL 86   TSH 2.72  Review of Systems  DATA OBTAINED: from patient, nurse, medical record,  GENERAL: Feels well No fevers, fatigue, change in appetite or weight  SKIN: No itch, rash skin tear left elbow  EYES: No eye pain, dryness or itching No change in vision  EARS: No earache, change in hearing  NOSE: No congestion, drainage or bleeding  MOUTH/THROAT: No mouth, tooth pain or sore throat No difficulty chewing or swallowing  RESPIRATORY: No cough, wheezing, SOB  CARDIAC: No chest pain, No edema.  GI: No abdominal pain No  N/V/D or constipation No heartburn or reflux  GU: No dysuria. Urinary frequency present (not new) No change in urine volume or character  MUSCULOSKELETAL: No joint pain, swelling or stiffness No back pain No muscle ache, pain, weakness    Physical Exam Filed Vitals:   07/22/12 1139  BP: 149/79  Pulse: 70  Temp: 97.2 F (36.2 C)  Resp: 18   GENERAL APPEARANCE: No acute distress, appropriately groomed, normal body habitus. Alert, pleasant, conversant.  HEAD: Normocephalic, atraumatic  EYES: Conjunctiva/lids clear. Pupils round, reactive.  EARS: External exam WNL, decreased hearing  NOSE: No deformity or discharge.  MOUTH/THROAT: Lips w/o lesions. Oral mucosa, tongue moist, w/o lesion. Oropharynx w/o redness or lesions.  NECK: Supple, full ROM. No thyroid tenderness, enlargement or nodule  LYMPHATICS: No head, neck or supraclavicular adenopathy  RESPIRATORY: Breathing is even, unlabored. Lung sounds are clear and full.  CARDIOVASCULAR: Heart IRRR. No murmur or extra heart sounds  ARTERIAL: No carotid bruit.  EDEMA: No peripheral edema.  GASTROINTESTINAL: Abdomen is soft, non-tender, not distended w/ normal bowel sounds.  MUSCULOSKELETAL: Moves all extremities with full ROM, strength and tone. Back is without kyphosis, scoliosis or spinal process tenderness.  NEUROLOGIC: Not Oriented to time, place. Speech clear, no tremor. Marland Kitchen  PSYCHIATRIC: Mood and affect appropriate to situation  ASSESSMENT/PLAN  Gait abnormality Witnessed fall 07/19/2012. Patient reported she had back pain, ED evaluation negative for acute change in CT scan of the head, cervical spine. No fractures noted on bilateral  hip x-rays. Today patient denies any pain, review of nursing record shows that she has been ambulating as usual.  Irregular heart rhythm Review of record shows patient's pulse has been nurses report patient is a little bit more anxious than usual. Today heart rhythm is irregular. Rate is controlled  about 70 beats per minute. EKG last week showed regular rhythm, patient may have intermittent irregularity of heart rhythm could account for some unsteadiness as well as anxiety. Will try low-dose beta blocker. If blood pressure is too low will stop amlodipine.    Follow up: Routine or as needed  Avir Deruiter T.Adelae Yodice, NP-C 07/22/2012

## 2012-07-22 NOTE — Assessment & Plan Note (Signed)
Witnessed fall 07/19/2012. Patient reported she had back pain, ED evaluation negative for acute change in CT scan of the head, cervical spine. No fractures noted on bilateral hip x-rays. Today patient denies any pain, review of nursing record shows that she has been ambulating as usual.

## 2012-07-22 NOTE — Assessment & Plan Note (Signed)
Review of record shows patient's pulse has been nurses report patient is a little bit more anxious than usual. Today heart rhythm is irregular. Rate is controlled about 70 beats per minute. EKG last week showed regular rhythm, patient may have intermittent irregularity of heart rhythm could account for some unsteadiness as well as anxiety. Will try low-dose beta blocker. If blood pressure is too low will stop amlodipine.

## 2012-07-25 ENCOUNTER — Encounter: Payer: Self-pay | Admitting: Geriatric Medicine

## 2012-07-31 ENCOUNTER — Encounter: Payer: Self-pay | Admitting: *Deleted

## 2012-09-23 ENCOUNTER — Non-Acute Institutional Stay: Payer: Medicare Other | Admitting: Geriatric Medicine

## 2012-09-23 ENCOUNTER — Encounter: Payer: Self-pay | Admitting: Geriatric Medicine

## 2012-09-23 DIAGNOSIS — I499 Cardiac arrhythmia, unspecified: Secondary | ICD-10-CM

## 2012-09-23 DIAGNOSIS — F329 Major depressive disorder, single episode, unspecified: Secondary | ICD-10-CM

## 2012-09-23 DIAGNOSIS — F0280 Dementia in other diseases classified elsewhere without behavioral disturbance: Secondary | ICD-10-CM

## 2012-09-23 NOTE — Progress Notes (Signed)
Patient ID: Dominique Martin, female   DOB: 1926/12/19, 79 y.o.   MRN: [id number] [facility] (13)  Code Status: Living Will, Full Code  Contact Information   Name Relation Home Work Mobile   [name], [name] [phone]         Chief Complaint  Patient presents with  . Medical Managment of Chronic Issues    HPI: This is a 79 y.o. female resident of [facility],  Memory Care section evaluated today for management of ongoing medical issues.  Review of record shows patient has had an increase in agitation in the last week period. Agitation usually centered around patient's confusion but she needs to pick up or be with her mother. She has received daily p.r.n. lorazepam for the last 6 days. Also 3 days earlier in the month. Unclear from documentation if these p.r.n. doses are effective. Vital signs have been stable, there's been no change in her general activity level, p.o. intake or her sleep. No recent fall or other injury. Beta blocker was started April 2000 in May 2014 in attempt to regulate patient's heart rhythm. Patient's blood pressure and pulse have remained stable. Review of record shows patient's cognitive and functional status is essentially unchanged.    Functional status Bathing: Maximal Assist,  Bladder Management: Occasional incontinence , Bowel Management: Occasional incontinence,  Feeding: Supervision,  Toileting / Clothing: Maximal Assist,  Walk: Stand by Assist/Walker    Allergies  Allergies  Allergen Reactions  . Aricept (Donepezil Hydrochloride)   . Penicillins   . Tubersol (Tuberculin Ppd)    Medications  Reviewed  Data Reviewed   Radiologic exams:    07/19/2012: CT scan of head, cervical neck. X-rays bilateral hips    WGN:[id number], external 05/27/2012: WBC 7.9, hemoglobin 13.3, hematocrit 39.9, platelets 208   Glucose 76, BUN 17, creatinine 0.77, sodium 140, potassium 4.1. Protein/LFTs WNL   Total  cholesterol 158, triglyceride 128, HDL 46, LDL 86   TSH 2.72  Review of Systems  DATA OBTAINED: from patient, nurse, medical record,  GENERAL: Feels well "... everything is perfect... I'm going to school, studying everything.."   No fevers, fatigue, change in appetite or weight  SKIN: No itch, rash  EYES: No eye pain, dryness or itching No change in vision  EARS: No earache, change in hearing  NOSE: No congestion, drainage or bleeding  MOUTH/THROAT: No mouth, tooth pain or sore throat No difficulty chewing or swallowing  RESPIRATORY: No cough, wheezing, SOB  CARDIAC: No chest pain, No edema.  GI: No abdominal pain No N/V/D or constipation No heartburn or reflux  occasional incontinence  GU: No dysuria. Urinary frequency present (not new) No change in urine volume or character occasional incontinence  MUSCULOSKELETAL: No joint pain, swelling or stiffness No back pain No muscle ache, pain, weakness  NEUROLOGIC: No dizziness, fainting, headache,  No change in mental status (dementia).  PSYCHIATRIC: Increased anxiety/agitation - see HPI. Sleeps well.      Physical Exam Filed Vitals:   09/23/12 1239  BP: 131/73  Pulse: 80  Temp: 97.1 F (36.2 C)  Resp: 16  Weight: 140 lb 8 oz (63.73 kg)  Body mass index is 24.1 kg/(m^2).  GENERAL APPEARANCE: No acute distress, appropriately groomed, normal body habitus. Alert, pleasant, conversant.  HEAD: Normocephalic, atraumatic  EYES: Conjunctiva/lids clear. Pupils round, reactive.  EARS: External exam WNL, decreased hearing  NOSE: No deformity or discharge.  MOUTH/THROAT: Lips w/o lesions. Oral mucosa, tongue moist, w/o lesion. Oropharynx  w/o redness or lesions.  NECK: Supple, full ROM. No thyroid tenderness, enlargement or nodule  LYMPHATICS: No head, neck or supraclavicular adenopathy  RESPIRATORY: Breathing is even, unlabored. Lung sounds are clear and full.  CARDIOVASCULAR: Heart IRRR. No murmur or extra heart sounds   ARTERIAL: No carotid  bruit.   EDEMA: No peripheral edema.  GASTROINTESTINAL: Abdomen is soft, non-tender, not distended w/ normal bowel sounds.  MUSCULOSKELETAL: Moves all extremities with full ROM, strength and tone. Back is without kyphosis, scoliosis or spinal process tenderness.  NEUROLOGIC: Not Oriented to time, place. Speech clear, no tremor. Marland Kitchen  PSYCHIATRIC: Mood and affect appropriate to situation  ASSESSMENT/PLAN  Depression Recent increase in anxiety/ agitation requiring daily prn dosing of lorazepam. Related to confusion re: place/ time- is often looking for her Mom. Will increase mirtazapine to help decrease symptoms. Continue 1:1 interventions, use BZD if needed.  Alzheimer's disease May 2014 MDS reviewed: BIMS 8/15, PHQ-9 0/ 27. Functional status is unchanged:  requires extensive assistance with transfers, toileting, dressing,bathung. Requires limited assistance with bed mobility and walking. Continues to participate in activities of choice on and off the unit. Spouse remains involved, visits often.  Irregular heart rhythm Blood pressure and pulse have been stable with current medication. Heart rate remains irregular at times, does not appear to have any bearing on level of anxiety.   Follow up: Routine or as needed  Christal Lagerstrom T.Daira Hine, NP-C 09/23/2012

## 2012-09-23 NOTE — Assessment & Plan Note (Addendum)
Recent increase in anxiety/ agitation requiring daily prn dosing of lorazepam. Related to confusion re: place/ time- is often looking for her Mom. Will increase mirtazapine to help decrease symptoms. Continue 1:1 interventions, use BZD if needed.

## 2012-09-30 NOTE — Assessment & Plan Note (Signed)
Blood pressure and pulse have been stable with current medication. Heart rate remains irregular at times, does not appear to have any bearing on level of anxiety.

## 2012-09-30 NOTE — Assessment & Plan Note (Signed)
May 2014 MDS reviewed: BIMS 8/15, PHQ-9 0/ 27. Functional status is unchanged:  requires extensive assistance with transfers, toileting, dressing,bathung. Requires limited assistance with bed mobility and walking. Continues to participate in activities of choice on and off the unit. Spouse remains involved, visits often.

## 2012-11-25 ENCOUNTER — Encounter: Payer: Self-pay | Admitting: Geriatric Medicine

## 2012-11-25 ENCOUNTER — Non-Acute Institutional Stay (SKILLED_NURSING_FACILITY): Payer: Medicare Other | Admitting: Geriatric Medicine

## 2012-11-25 DIAGNOSIS — N318 Other neuromuscular dysfunction of bladder: Secondary | ICD-10-CM

## 2012-11-25 DIAGNOSIS — R269 Unspecified abnormalities of gait and mobility: Secondary | ICD-10-CM

## 2012-11-25 DIAGNOSIS — F0280 Dementia in other diseases classified elsewhere without behavioral disturbance: Secondary | ICD-10-CM

## 2012-11-25 DIAGNOSIS — N3281 Overactive bladder: Secondary | ICD-10-CM

## 2012-11-25 DIAGNOSIS — F411 Generalized anxiety disorder: Secondary | ICD-10-CM

## 2012-11-25 NOTE — Progress Notes (Signed)
Patient ID: Dominique Martin, female   DOB: 07-27-1926, 79 y.o.   MRN: [id number] [facility] [phone])  Code Status: Living Will, Full Code  Contact Information   Name Relation Home Work Mobile   Dominique Martin, Dominique Martin [phone]         Chief Complaint  Patient presents with  . Medical Managment of Chronic Issues    AD, Anxiety,     HPI: This is a 79 y.o. female resident of [facility],  Memory Care section evaluated today for management of ongoing medical issues.  Nurses report increased anxiety/ agitation recently. Sertraline dose was decreased September 9  Per pharmacy request and CMS guidelines. Review of record shows patient with frequent anxiety episodes prior to this medication change. Anxiety episodes continue to frequently center around looking for family members. Most often 1-1 interventions are useful in reducing patient's anxiety and agitation, it is noted in the record that Ativan is not particularly effective. It's also noted that patient has multiple request for BR use in short span of time (i.e. dinner). Nurse tells me today when the patient is brought to the toilet she frequently cannot empty her bladder. It sometimes takes a while sitting on the toilet or multiple attempts to empty her bladder.  Vital signs have been stable, no change in her general activity level, p.o. intake or her sleep.  The patient continues to attempt ambulation on her own, last fall was September 29 no injuries. Patient has no complaints of pain today.   Review of MDS shows patient's cognitive and functional status is essentially unchanged. Patient does have occasions when she rejects care. Staff also reports delusional behavior.    Functional status Bathing: Maximal Assist,  Bladder Management: Occasional incontinence , Bowel Management: Occasional incontinence,  Feeding: Supervision,  Toileting / Clothing: Maximal Assist,  Walk: Stand by  Assist/Walker    Allergies  Allergies  Allergen Reactions  . Aricept [Donepezil Hydrochloride]   . Penicillins   . Tubersol [Tuberculin Ppd]    Medications  Reviewed  Data Reviewed   Radiologic exams:    07/19/2012: CT scan of head, cervical neck. X-rays bilateral hips    BMW:[id number], external 05/27/2012: WBC 7.9, hemoglobin 13.3, hematocrit 39.9, platelets 208   Glucose 76, BUN 17, creatinine 0.77, sodium 140, potassium 4.1. Protein/LFTs WNL   Total cholesterol 158, triglyceride 128, HDL 46, LDL 86   TSH 2.72  Review of Systems  DATA OBTAINED: from patient, nurse, medical record,  GENERAL: Feels well    No fevers, fatigue, change in appetite or weight  SKIN: No itch, rash  EYES: No eye pain, dryness or itching No change in vision  EARS: No earache, change in hearing  NOSE: No congestion, drainage or bleeding  MOUTH/THROAT: No mouth, tooth pain or sore throat No difficulty chewing or swallowing  RESPIRATORY: No cough, wheezing, SOB  CARDIAC: No chest pain, No edema.  GI: No abdominal pain No N/V/D or constipation No heartburn or reflux  occasional incontinence  GU: No dysuria. Urinary frequency present (not new) No change in urine volume or character occasional incontinence  MUSCULOSKELETAL: No joint pain, swelling or stiffness No back pain No muscle ache, pain, weakness  NEUROLOGIC: No dizziness, fainting, headache,  No change in mental status (dementia).  PSYCHIATRIC: Increased anxiety/agitation - see HPI. Sleeps well.      Physical Exam Filed Vitals:   11/25/12 1141  BP: 106/67  Pulse: 76  Temp: 98.3 F (36.8 C)  Resp: 16  Weight: 137 lb (62.143 kg)  Body mass index is 23.5 kg/(m^2).  GENERAL APPEARANCE: No acute distress, appropriately groomed, normal body habitus. Alert, pleasant, conversant.  HEAD: Normocephalic, atraumatic  EYES: Conjunctiva/lids clear. Pupils round, reactive.  EARS: External exam WNL, decreased hearing  NOSE: No deformity or discharge.   MOUTH/THROAT: Lips w/o lesions. Oral mucosa, tongue moist, w/o lesion. Oropharynx w/o redness or lesions.  NECK: Supple, full ROM. No thyroid tenderness, enlargement or nodule  LYMPHATICS: No head, neck or supraclavicular adenopathy  RESPIRATORY: Breathing is even, unlabored. Lung sounds are clear and full.  CARDIOVASCULAR: Heart RRR. No murmur or extra heart sounds   ARTERIAL: No carotid bruit.   EDEMA: No peripheral edema.  GASTROINTESTINAL: Abdomen is soft, non-tender, not distended w/ normal bowel sounds.  MUSCULOSKELETAL: Moves all extremities with full ROM, strength and tone. Back is without kyphosis, scoliosis or spinal process tenderness.  NEUROLOGIC: Not Oriented to time, place. Speech clear, no tremor. Marland Kitchen  PSYCHIATRIC: Mood and affect appropriate to situation  ASSESSMENT/PLAN  Alzheimer's disease August 2014 MDS reviewed: BIMS 7/15, functional status is unchanged from previous review. Patient continues to participate in activities of choice on and off the unit, spouse remains involved, visits often.  Gait disorder Long-standing gait instability problem, no clear etiology. Patient is most safe when mobilizing in manual wheelchair. She continues to insist she can ambulate on her own at times. She does have falls from time to time,  most recently yesterday, no injury..  Generalized anxiety disorder Anxiety and depression have been treated with a combination of sertraline and mirtazapine. No significant depressive symptoms but anxiety continues to be a problem. Tapering of sertraline is in progress as this does not appear to be helping the anxiety. Will increase mirtazapine further.  Overactive bladder Patient has been receiving oxybutynin for symptoms of overactive bladder; urinary frequency. Patient has very occasional urinary incontinence. There is some evidence this patient may be experiencing some urinary bladder retention, this may be adding to anxiety/agitation. Will stop  oxybutynin and monitor. Consider use of Myrbetriq if urinary frequency and/or incontinence occurs.   Follow up: Routine or as needed  Asta Corbridge T.Trammell Bowden, NP-C 11/25/2012

## 2012-11-25 NOTE — Assessment & Plan Note (Signed)
Patient has been receiving oxybutynin for symptoms of overactive bladder; urinary frequency. Patient has very occasional urinary incontinence. There is some evidence this patient may be experiencing some urinary bladder retention, this may be adding to anxiety/agitation. Will stop oxybutynin and monitor. Consider use of Myrbetriq if urinary frequency and/or incontinence occurs.

## 2012-11-25 NOTE — Assessment & Plan Note (Addendum)
Long-standing gait instability problem, no clear etiology. Patient is most safe when mobilizing in manual wheelchair. She continues to insist she can ambulate on her own at times. She does have falls from time to time,  most recently yesterday, no injury.Marland Kitchen

## 2012-11-25 NOTE — Assessment & Plan Note (Signed)
August 2014 MDS reviewed: BIMS 7/15, functional status is unchanged from previous review. Patient continues to participate in activities of choice on and off the unit, spouse remains involved, visits often.

## 2012-11-25 NOTE — Assessment & Plan Note (Signed)
Anxiety and depression have been treated with a combination of sertraline and mirtazapine. No significant depressive symptoms but anxiety continues to be a problem. Tapering of sertraline is in progress as this does not appear to be helping the anxiety. Will increase mirtazapine further.

## 2012-11-27 ENCOUNTER — Emergency Department (HOSPITAL_COMMUNITY)
Admission: EM | Admit: 2012-11-27 | Discharge: 2012-11-27 | Disposition: A | Discharge status: Home / Self Care | Payer: Medicare Other | Attending: Emergency Medicine | Admitting: Emergency Medicine

## 2012-11-27 ENCOUNTER — Emergency Department (HOSPITAL_COMMUNITY): Payer: Medicare Other

## 2012-11-27 ENCOUNTER — Encounter (HOSPITAL_COMMUNITY): Payer: Self-pay | Admitting: Emergency Medicine

## 2012-11-27 DIAGNOSIS — Z880 Allergy status to penicillin: Secondary | ICD-10-CM | POA: Insufficient documentation

## 2012-11-27 DIAGNOSIS — E559 Vitamin D deficiency, unspecified: Secondary | ICD-10-CM | POA: Insufficient documentation

## 2012-11-27 DIAGNOSIS — Y9389 Activity, other specified: Secondary | ICD-10-CM | POA: Insufficient documentation

## 2012-11-27 DIAGNOSIS — Z87891 Personal history of nicotine dependence: Secondary | ICD-10-CM | POA: Insufficient documentation

## 2012-11-27 DIAGNOSIS — F0280 Dementia in other diseases classified elsewhere without behavioral disturbance: Secondary | ICD-10-CM | POA: Insufficient documentation

## 2012-11-27 DIAGNOSIS — Z87311 Personal history of (healed) other pathological fracture: Secondary | ICD-10-CM | POA: Insufficient documentation

## 2012-11-27 DIAGNOSIS — Y9210 Unspecified residential institution as the place of occurrence of the external cause: Secondary | ICD-10-CM | POA: Insufficient documentation

## 2012-11-27 DIAGNOSIS — K219 Gastro-esophageal reflux disease without esophagitis: Secondary | ICD-10-CM | POA: Insufficient documentation

## 2012-11-27 DIAGNOSIS — I10 Essential (primary) hypertension: Secondary | ICD-10-CM | POA: Insufficient documentation

## 2012-11-27 DIAGNOSIS — G43001 Migraine without aura, not intractable, with status migrainosus: Secondary | ICD-10-CM | POA: Insufficient documentation

## 2012-11-27 DIAGNOSIS — F411 Generalized anxiety disorder: Secondary | ICD-10-CM | POA: Insufficient documentation

## 2012-11-27 DIAGNOSIS — Z8781 Personal history of (healed) traumatic fracture: Secondary | ICD-10-CM | POA: Insufficient documentation

## 2012-11-27 DIAGNOSIS — Z8619 Personal history of other infectious and parasitic diseases: Secondary | ICD-10-CM | POA: Insufficient documentation

## 2012-11-27 DIAGNOSIS — G309 Alzheimer's disease, unspecified: Secondary | ICD-10-CM | POA: Insufficient documentation

## 2012-11-27 DIAGNOSIS — Z79899 Other long term (current) drug therapy: Secondary | ICD-10-CM | POA: Insufficient documentation

## 2012-11-27 DIAGNOSIS — F329 Major depressive disorder, single episode, unspecified: Secondary | ICD-10-CM | POA: Insufficient documentation

## 2012-11-27 DIAGNOSIS — R0781 Pleurodynia: Secondary | ICD-10-CM

## 2012-11-27 DIAGNOSIS — F3289 Other specified depressive episodes: Secondary | ICD-10-CM | POA: Insufficient documentation

## 2012-11-27 DIAGNOSIS — S298XXA Other specified injuries of thorax, initial encounter: Secondary | ICD-10-CM | POA: Insufficient documentation

## 2012-11-27 DIAGNOSIS — R296 Repeated falls: Secondary | ICD-10-CM | POA: Insufficient documentation

## 2012-11-27 DIAGNOSIS — M810 Age-related osteoporosis without current pathological fracture: Secondary | ICD-10-CM | POA: Insufficient documentation

## 2012-11-27 DIAGNOSIS — W19XXXA Unspecified fall, initial encounter: Secondary | ICD-10-CM

## 2012-11-27 NOTE — ED Notes (Signed)
Bed: AV40 Expected date:  Expected time:  Means of arrival:  Comments: EMS/79 yo upper right back pain-from SNF-helped to ground/no fall-no pain

## 2012-11-27 NOTE — ED Notes (Addendum)
Pt comes from memory care unit at [facility] and does not remember she fell. Pt was in dining room and stood up and lost balance and was assisted to lying position on floor. Pt states she has upper right back pain, denies loc.

## 2012-11-27 NOTE — Progress Notes (Signed)
CSW met with pts family at bedside.  Pts son Peyton Najjar confirmed that pt is a resident of [facility] and that the plan is for her to return.  Son reports that pts spouse, Loriana Samad is HCPOA and that his correct home contact number is [phone].    Marland KitchenMarva Panda, LCSWA  [phone] .11/27/2012 8:15 pm

## 2012-11-27 NOTE — ED Provider Notes (Signed)
CSN: [id number]     Arrival date & time 11/27/12  1932 History   First MD Initiated Contact with Patient 11/27/12 1951     Chief Complaint  Patient presents with  . Fall   (Consider location/radiation/quality/duration/timing/severity/associated sxs/prior Treatment) HPI Patient presents to the emergency department following a fall that occurred just prior to arrival.  The patient was standing up from the dinner table when she was trying to step back into her wheelchair.  The staff was helping her when she stumbled and they lowered her to the floor.  Patient, states she did not loose consciousness.  The EMS.  Nursing home report also, states, was no loss of consciousness.  She did not strike her head.  The patient denies chest pain, shortness of breath, headache, blurred vision, nausea, vomiting, back or neck pain.  Patient, states she is having some mild right-sided rib pain Past Medical History  Diagnosis Date  . Vitamin D deficiency   . Hypercholesterolemia   . Alzheimer disease 2013  . Migraine   . Depression 2013  . BPPV (benign paroxysmal positional vertigo)   . Diverticulosis of colon (without mention of hemorrhage) 08/14/2011    Also ischemic colitis  . GERD (gastroesophageal reflux disease)   . Tremor, essential 2004  . Gait disorder   . Herpes zoster 2006  . Hypertension   . Osteoporosis     tx w/ bisphosphonate 2007-2013  . Pulmonary nodule/lesion, solitary 03/2011    5mm RUL nodule  . Vertebral compression fracture 03/2011    L1  . Vitamin D deficiency   . Herpes zoster without mention of complication 08/14/2011  . Anxiety state, unspecified 07/03/2011  . Urinary frequency 07/03/2011  . Chest pain, unspecified 05/21/2011  . External hemorrhoids without mention of complication 05/08/2011  . Migraine without aura, without mention of intractable migraine with status migrainosus 04/29/2011  . Pathologic fracture of vertebrae 04/29/2011   Past Surgical History  Procedure  Laterality Date  . Bilateral salpingoophorectomy    . Carpal tunnel release    . Colonoscopy  01/20/2004    Dr. Lina Sar   History reviewed. No pertinent family history. History  Substance Use Topics  . Smoking status: Former Smoker    Quit date: 04/20/1980  . Smokeless tobacco: Never Used  . Alcohol Use: No   OB History   Grav Para Term Preterm Abortions TAB SAB Ect Mult Living                 Review of Systems All other systems negative except as documented in the HPI. All pertinent positives and negatives as reviewed in the HPI. Allergies  Aricept; Penicillins; and Tubersol  Home Medications   Current Outpatient Rx  Name  Route  Sig  Dispense  Refill  . acetaminophen (TYLENOL) 500 MG tablet   Oral   Take 1,000 mg by mouth every 6 (six) hours as needed. Take 1 tablet every 6 hours as needed for pain.         Marland Kitchen amLODipine (NORVASC) 2.5 MG tablet   Oral   Take 2.5 mg by mouth daily. Take 1 daily to treat high BP.         Marland Kitchen Calcium Carbonate-Vitamin D (CALCIUM 600+D3) 600-400 MG-UNIT per tablet   Oral   Take 1 tablet by mouth 2 (two) times daily. Take one twice daily for bone/muscle health.(8pm)         . Carboxymethylcellulose Sodium 0.25 % SOLN   Ophthalmic   Apply  1 drop to eye 2 (two) times daily.         Marland Kitchen docusate sodium (COLACE) 100 MG capsule   Oral   Take 100 mg by mouth daily.          Marland Kitchen LORazepam (ATIVAN) 0.5 MG tablet      Take one tablet every 8 hours as needed for agitation   90 tablet   5   . memantine (NAMENDA) 10 MG tablet   Oral   Take 10 mg by mouth 2 (two) times daily.         . metoprolol tartrate (LOPRESSOR) 12.5 mg TABS   Oral   Take 0.5 tablets (12.5 mg total) by mouth 2 (two) times daily.         . mirtazapine (REMERON) 30 MG tablet   Oral   Take 30 mg by mouth at bedtime.         Marland Kitchen omeprazole (PRILOSEC) 20 MG capsule   Oral   Take 20 mg by mouth daily. Take one daily to reduce stomach acid.         .  phenylephrine-shark liver oil-mineral oil-petrolatum (PREPARATION H) 0.25-3-14-71.9 % rectal ointment   Rectal   Place 1 application rectally 2 (two) times daily as needed for hemorrhoids. Apply to just inside in the anus after each bowl movement         . senna (SENOKOT) 8.6 MG TABS   Oral   Take 1 tablet by mouth at bedtime as needed (for no BM x 2 days).         . sertraline (ZOLOFT) 50 MG tablet   Oral   Take 50 mg by mouth at bedtime.          . Vitamin D, Ergocalciferol, (DRISDOL) 50000 UNITS CAPS   Oral   Take 50,000 Units by mouth every 7 (seven) days. On Friday          BP 127/80  Pulse 68  Temp(Src) 97.9 F (36.6 C) (Oral)  Resp 18  SpO2 95% Physical Exam  Nursing note and vitals reviewed. Constitutional: She is oriented to person, place, and time. She appears well-developed and well-nourished. No distress.  HENT:  Head: Normocephalic and atraumatic.  Mouth/Throat: Oropharynx is clear and moist.  Eyes: Pupils are equal, round, and reactive to light.  Neck: Normal range of motion. Neck supple.  Cardiovascular: Normal rate, regular rhythm and normal heart sounds.  Exam reveals no gallop and no friction rub.   No murmur heard. Pulmonary/Chest: Effort normal and breath sounds normal. No respiratory distress. She exhibits tenderness.  Neurological: She is alert and oriented to person, place, and time. She exhibits normal muscle tone. Coordination normal.  Skin: Skin is warm and dry. No rash noted. No erythema.    ED Course  Procedures (including critical care time) Labs Review Labs Reviewed - No data to display Imaging Review Dg Ribs Unilateral W/chest Right  11/27/2012   CLINICAL DATA:  Larey Seat. Right rib pain.  EXAM: RIGHT RIBS AND CHEST - 3+ VIEW  COMPARISON:  04/23/2011.  FINDINGS: The cardiac silhouette, mediastinal and hilar contours are stable. Mitral valve annular calcifications are noted. There is mild tortuosity and calcification of the thoracic aorta.  No pleural effusion or pneumothorax.  Dedicated views of the right ribs demonstrate no definite acute fractures.  IMPRESSION: No acute cardiopulmonary findings and no definite rib fractures.   Electronically Signed   By: Loralie Champagne M.D.   On: 11/27/2012 21:29  patient has no signs of rib fracture noted on x-ray.  The patient has no other complaints.  Patient be sent back to the nursing facility with instructions to followup with her primary care Dr. for recheck.  Told to return here as needed  MDM      Carlyle Dolly, PA-C 11/27/12 2212

## 2012-11-28 ENCOUNTER — Non-Acute Institutional Stay (SKILLED_NURSING_FACILITY): Payer: Medicare Other | Admitting: Geriatric Medicine

## 2012-11-28 ENCOUNTER — Encounter: Payer: Self-pay | Admitting: Geriatric Medicine

## 2012-11-28 DIAGNOSIS — R269 Unspecified abnormalities of gait and mobility: Secondary | ICD-10-CM

## 2012-11-28 NOTE — ED Provider Notes (Signed)
Medical screening examination/treatment/procedure(s) were conducted as a shared visit with non-physician practitioner(s) and myself.  I personally evaluated the patient during the encounter Pt with mechanical fall, no trauma to head.  No pain along spine, no pain on ROM of extremities.  Mild TTP along ribs.  Rolan Bucco, MD 11/28/12 0001

## 2012-11-28 NOTE — Progress Notes (Signed)
Patient ID: Dominique Martin, female   DOB: 27-Nov-1926, 79 y.o.   MRN: [id number] [facility] [phone])  Code Status: Living Will, Full Code Contact Information   Name Relation Home Work Mobile   [place], [place] [phone]        Chief Complaint  Patient presents with  . Follow-up    HPI: This is a 79 y.o. female resident of [facility],  Memory Care section.  Evaluation is requested today to follow fall yesterday evening. Review of record shows patient fell in the dining room after getting up from her wheelchair and attempting to walk without assistance. She complained immediately of pain right upper leg (femur) and back, was unable to move lower extremities with ROM. CNA stated that resident did not hit her head.On Call provider recommended ED evaluation. When patient arrived in the emergency department her only complaint was of right rib pain. X-rays revealed no fracture of the ribs. Patient was returned to Memory Care section at [facility]. Today patient reports no pain, nurse reports patient is at her usual level of alertness and activity.     Allergies  Allergen Reactions  . Aricept [Donepezil Hydrochloride]   . Penicillins   . Tubersol [Tuberculin Ppd]    Medications    DATA REVIEWED  Radiologic Exams 11/27/2012  RIGHT RIBS AND CHEST - 3+ VIEW COMPARISON: 04/23/2011.  FINDINGS:  The cardiac silhouette, mediastinal and hilar contours are stable. Mitral valve annular calcifications are noted. There is mild tortuosity and calcification of the thoracic aorta. No pleural  effusion or pneumothorax. Dedicated views of the right ribs demonstrate no definite acute fractures.  IMPRESSION:  No acute cardiopulmonary findings and no definite rib fractures.  Cardiovascular Exams:   Laboratory Studies:       Review of Systems  DATA OBTAINED: from patient, nurse, medical record, caregiver, family member GENERAL: Feels well  No  fevers, fatigue, change in activity status, appetite, or weight  RESPIRATORY: No cough, wheezing, SOB CARDIAC: No chest pain, palpitations. No edema GI: No abdominal pain  No N/V/D or constipation  No heartburn or reflux  MUSCULOSKELETAL: No joint pain, swelling or stiffness No back pain No muscle ache, pain, weakness   Gait is unsteady last fall 11/27/12 NEUROLOGIC: No dizziness, fainting, headache, No change in mental status (dementia)  PSYCHIATRIC: Intermittent signs/ behavior re:  anxiety, agitation  Sleeps well     Physical Exam Filed Vitals:   11/28/12 1426  BP: 120/74  Pulse: 81  Temp: 97.1 F (36.2 C)  Resp: 16   There is no weight on file to calculate BMI.  GENERAL APPEARANCE: No acute distress, appropriately groomed, normal body habitus Alert, pleasant, conversant. HEAD: Normocephalic, atraumatic EYES: Conjunctiva/lids clear  RESPIRATORY: Breathing is even, unlabored Lung sounds are clear and full  CARDIOVASCULAR: Heart RRR   No murmur or extra heart sounds   EDEMA: No peripheral or periorbital edema  No ascites MUSCULOSKELETAL.   Moves all extremities easily, painless PSYCHIATRIC: Mood and affect appropriate to situation   ASSESSMENT/PLAN  Gait disorder Long-standing gait instability problem, no clear etiology, but exacerbated by memory impairment.  Patient is most safe when mobilizing in manual wheelchair. She continues to insist she can ambulate on her own at times. Continues to have falls from time to time, most recently yesterday. Initially complained of hip and back pain, In ED complained of right rib pain, X-rays negative  for fractures. Patient is without pain today has returned to usual level of  activity.     Follow up: Routine or as needed  Shamiracle Gorden T.Harika Laidlaw, NP-C 11/28/2012

## 2012-12-01 LAB — BASIC METABOLIC PANEL
BUN: 16 mg/dL (ref 4–21)
Creatinine: 0.9 mg/dL (ref 0.5–1.1)
Potassium: 4.1 mmol/L (ref 3.4–5.3)

## 2012-12-12 NOTE — Assessment & Plan Note (Signed)
Long-standing gait instability problem, no clear etiology, but exacerbated by memory impairment.  Patient is most safe when mobilizing in manual wheelchair. She continues to insist she can ambulate on her own at times. Continues to have falls from time to time, most recently yesterday. Initially complained of hip and back pain, In ED complained of right rib pain, X-rays negative  for fractures. Patient is without pain today has returned to usual level of activity.

## 2013-02-03 ENCOUNTER — Encounter: Payer: Self-pay | Admitting: Geriatric Medicine

## 2013-02-03 ENCOUNTER — Non-Acute Institutional Stay (SKILLED_NURSING_FACILITY): Payer: Medicare Other | Admitting: Geriatric Medicine

## 2013-02-03 DIAGNOSIS — N3281 Overactive bladder: Secondary | ICD-10-CM

## 2013-02-03 DIAGNOSIS — G250 Essential tremor: Secondary | ICD-10-CM

## 2013-02-03 DIAGNOSIS — R269 Unspecified abnormalities of gait and mobility: Secondary | ICD-10-CM

## 2013-02-03 DIAGNOSIS — F0280 Dementia in other diseases classified elsewhere without behavioral disturbance: Secondary | ICD-10-CM

## 2013-02-03 DIAGNOSIS — I10 Essential (primary) hypertension: Secondary | ICD-10-CM

## 2013-02-03 DIAGNOSIS — K219 Gastro-esophageal reflux disease without esophagitis: Secondary | ICD-10-CM

## 2013-02-03 DIAGNOSIS — N318 Other neuromuscular dysfunction of bladder: Secondary | ICD-10-CM

## 2013-02-03 DIAGNOSIS — M810 Age-related osteoporosis without current pathological fracture: Secondary | ICD-10-CM

## 2013-02-03 NOTE — Progress Notes (Signed)
Patient ID: Dominique Martin, female   DOB: 04/03/1926, 79 y.o.   MRN: [id number]  [facility] [phone])  Code Status: Living Will, Full Code  Contact Information   Name Relation Home Work [place] [phone]  [phone]   Ian Bushman   [phone]   Rayan, Dyal [phone]         Chief Complaint  Patient presents with  . Annual Exam    Comprehensive exam  . Medical Managment of Chronic Issues    HPI: This is a 79 y.o. female resident of [facility] Memory Care section. This patient  has not had a hospitalization, serious illness or injury in the last year. Recent visits: Gait disorder Long-standing gait instability problem, no clear etiology, but exacerbated by memory impairment.  Patient is most safe when mobilizing in manual wheelchair. She continues to insist she can ambulate on her own at times. Continues to have falls from time to time, most recently yesterday. Initially complained of hip and back pain, In ED complained of right rib pain, X-rays negative  for fractures. Patient is without pain today has returned to usual level of activity.    Depression Recent increase in anxiety/ agitation requiring daily prn dosing of lorazepam. Related to confusion re: place/ time- is often looking for her Mom. Will increase mirtazapine to help decrease symptoms. Continue 1:1 interventions, use BZD if needed.  Alzheimer's disease May 2014 MDS reviewed: BIMS 8/15, PHQ-9 0/ 27. Functional status is unchanged:  requires extensive assistance with transfers, toileting, dressing,bathing. Requires limited assistance with bed mobility and walking. Continues to participate in activities of choice on and off the unit. Spouse remains involved, visits often.  Irregular heart rhythm Blood pressure and pulse have been stable with current medication. Heart rate remains irregular at times, does not appear to have any bearing  on level of anxiety.  Generalized anxiety disorder Anxiety and depression have been treated with a combination of sertraline and mirtazapine. No significant depressive symptoms but anxiety continues to be a problem. Tapering of sertraline is in progress as this does not appear to be helping the anxiety. Will increase mirtazapine further.  Overactive bladder Patient has been receiving oxybutynin for symptoms of overactive bladder; urinary frequency. Patient has very occasional urinary incontinence. There is some evidence this patient may be experiencing some urinary bladder retention, this may be adding to anxiety/agitation. Will stop oxybutynin and monitor. Consider use of Myrbetriq if urinary frequency and/or incontinence occurs.  Since last visit patient's overall status is stable: VS and weight satisfactory, eats/ sleeps well. Continues to have periods of increased anxiety/ agitation re: ambulation/ assistance as well as looking for her mother. Less frequent and less severe lately, last use of prn BZD was 12/1. Patient does continue to insist she is able to ambulate safely on her own. Most recent MDS did not show any significant decline in patient's cognitive or functional status There has been no change in patient's urinary frequency or incontinence since stopping oxybutynin   Functional status Bathing: Maximal Assist,  Bladder Management: Occasional incontinence , Bowel Management: Continent,  Feeding: Supervision,  Toileting / Clothing: Maximal Assist,  Bed Mobility/Transfers: Limited assist  Walk: Stand by Assist/Walker    Allergies  Allergies  Allergen Reactions  . Aricept [Donepezil Hydrochloride]   . Penicillins   . Tubersol [Tuberculin Ppd]      Medication List       This list is accurate as of: 02/03/13 11:59  PM.  Always use your most recent med list.               acetaminophen 500 MG tablet  Commonly known as:  TYLENOL  Take 1,000 mg by mouth every 6 (six) hours as  needed. Take 1 tablet every 6 hours as needed for pain.     amLODipine 2.5 MG tablet  Commonly known as:  NORVASC  Take 2.5 mg by mouth daily. Take 1 daily to treat high BP.     CALCIUM 600+D3 600-400 MG-UNIT per tablet  Generic drug:  Calcium Carbonate-Vitamin D  Take 1 tablet by mouth 2 (two) times daily. Take one twice daily for bone/muscle health.(8pm)     Carboxymethylcellulose Sodium 0.25 % Soln  Apply 1 drop to eye 2 (two) times daily.     docusate sodium 100 MG capsule  Commonly known as:  COLACE  Take 100 mg by mouth daily.     LORazepam 0.5 MG tablet  Commonly known as:  ATIVAN  Take one tablet every 8 hours as needed for agitation     memantine 10 MG tablet  Commonly known as:  NAMENDA  Take 10 mg by mouth 2 (two) times daily.     metoprolol tartrate 12.5 mg Tabs tablet  Commonly known as:  LOPRESSOR  Take 0.5 tablets (12.5 mg total) by mouth 2 (two) times daily.     mirtazapine 30 MG tablet  Commonly known as:  REMERON  Take 30 mg by mouth at bedtime.     omeprazole 20 MG capsule  Commonly known as:  PRILOSEC  Take 20 mg by mouth daily. Take one daily to reduce stomach acid.     phenylephrine-shark liver oil-mineral oil-petrolatum 0.25-3-14-71.9 % rectal ointment  Commonly known as:  PREPARATION H  Place 1 application rectally 2 (two) times daily as needed for hemorrhoids. Apply to just inside in the anus after each bowl movement     senna 8.6 MG Tabs tablet  Commonly known as:  SENOKOT  Take 1 tablet by mouth at bedtime as needed (for no BM x 2 days).     sertraline 50 MG tablet  Commonly known as:  ZOLOFT  Take 50 mg by mouth at bedtime.     Vitamin D (Ergocalciferol) 50000 UNITS Caps capsule  Commonly known as:  DRISDOL  Take 50,000 Units by mouth every 7 (seven) days. On Friday        Data Reviewed   Radiologic exams:    07/19/2012: CT scan of head, cervical neck. X-rays bilateral hips    ZOX:WRUEAVW, external 05/27/2012: WBC 7.9,  hemoglobin 13.3, hematocrit 39.9, platelets 208   Glucose 76, BUN 17, creatinine 0.77, sodium 140, potassium 4.1. Protein/LFTs WNL   Total cholesterol 158, triglyceride 128, HDL 46, LDL 86   TSH 2.72  Lab Results- [facility] 12/01/2012  Component Value   NA 138   K 4.1   CREATININE 0.9   BUN 16   Glucose 96       TSH 2.18   Past Medical History  Diagnosis Date  . Vitamin D deficiency   . Hypercholesterolemia   . Alzheimer disease 2013  . Depression 2013  . BPPV (benign paroxysmal positional vertigo)   . Diverticulosis of colon (without mention of hemorrhage) 08/14/2011    Also ischemic colitis  . GERD (gastroesophageal reflux disease)   . Tremor, essential 2004  . Gait disorder   . Herpes zoster 2006  . Hypertension   . Osteoporosis  tx w/ bisphosphonate 2007-2013  . Pulmonary nodule/lesion, solitary 03/2011    5mm RUL nodule  . Vertebral compression fracture 03/2011    L1  . Vitamin D deficiency   . Herpes zoster without mention of complication 08/14/2011  . Anxiety state, unspecified 07/03/2011  . Urinary frequency 07/03/2011  . Chest pain, unspecified 05/21/2011  . External hemorrhoids without mention of complication 05/08/2011  . Migraine without aura, without mention of intractable migraine with status migrainosus 04/29/2011  . Pathologic fracture of vertebrae 04/29/2011   Past Surgical History  Procedure Laterality Date  . Bilateral salpingoophorectomy    . Carpal tunnel release    . Colonoscopy  01/20/2004    Dr. Lina Sar   Family Status  Relation Status Death Age  . Son Alive   . Son Alive    History   Social History Narrative   Patient is Married Psychiatric nurse). Occupations included teaching, real estate.   Lives in  Memory Care section at [facility] since 02/2011. Spouse lives in independent living section same community   No Smoking history, minimal  Alcohol history   Patient has  Advanced planning documents: Living Will                Review of Systems  DATA OBTAINED: from patient, nurse, medical record,  GENERAL: Feels well "I have no problems.Marland KitchenMarland KitchenI'm very healthy..."   No fevers, fatigue, change in appetite or weight  SKIN: No itch, rash  EYES: No eye pain, dryness or itching No change in vision  EARS: No earache, change in hearing  NOSE: No congestion, drainage or bleeding  MOUTH/THROAT: No mouth, tooth pain or sore throat No difficulty chewing or swallowing  RESPIRATORY: No cough, wheezing, SOB  CARDIAC: No chest pain, No edema.  GI: No abdominal pain No N/V/D or constipation No heartburn or reflux  occasional incontinence  GU: No dysuria. Urinary frequency present (not new) No change in urine volume or character occasional incontinence  MUSCULOSKELETAL: No joint pain, swelling or stiffness No back pain No muscle ache, pain, weakness  NEUROLOGIC: No dizziness, fainting, headache,  No change in mental status (dementia).  PSYCHIATRIC: Intermittent anxiety/ agitaiton Sleeps well.      Physical Exam Filed Vitals:   02/03/13 1716  BP: 113/72  Pulse: 71  Temp: 98.2 F (36.8 C)  Resp: 18  Weight: 138 lb 8 oz (62.823 kg)  SpO2: 96%  Body mass index is 23.76 kg/(m^2).  GENERAL APPEARANCE: No acute distress, appropriately groomed, normal body habitus. Alert, pleasant, conversant.  HEAD: Normocephalic, atraumatic  EYES: Conjunctiva/lids clear. Pupils round, reactive.  EARS: External exam WNL, canals clear, decreased hearing  NOSE: No deformity or discharge.  MOUTH/THROAT: Lips w/o lesions. Oral mucosa, tongue moist, w/o lesion. Oropharynx w/o redness or lesions.  NECK: Supple, full ROM. No thyroid tenderness, enlargement or nodule  LYMPHATICS: No head, neck or supraclavicular adenopathy  RESPIRATORY: Breathing is even, unlabored. Lung sounds are clear and full.  CHEST/BREASTS: No chest deformity. Breasts without tenderness, mass, discharge CARDIOVASCULAR: Heart RRR. No murmur or extra heart sounds    ARTERIAL: No carotid bruit. Pulse: Carotid 2+, DP(bil) 2+  EDEMA: No peripheral edema.  GASTROINTESTINAL: Abdomen is soft, non-tender, not distended w/ normal bowel sounds.  No hepatic or splenic enlargement. No mass, ventral or inguinal hernia. MUSCULOSKELETAL: Moves all extremities with full ROM, strength and tone. Back is without kyphosis, scoliosis or spinal process tenderness. Gait is not assessed to day, self propels in [facility] NEUROLOGIC: Not Oriented to  time, place. Cranial nerves 2-12 grossly intact, speech clear, no tremor.  PSYCHIATRIC: Mood and affect appropriate to situation  ASSESSMENT/PLAN  Hypertension Recent blood pressure range 104-152/68-90. Continue current medications, recent lab satisfactory   GERD (gastroesophageal reflux disease) No report of GI symptoms, continue current medication  Alzheimer's disease  November 2014 MDS: BIMS 11/15, PHQ-9 0/27. Functional status is unchanged: requires extensive assistance with transfers, toileting, dressing,bathung. Requires limited assistance with bed mobility and walking. Continues to participate in activities of choice on and off the unit. Spouse remains involved, visits often. Does have intermittent problem with anxiety/agitation most often resolve with one-to-one intervention. Does require occasional p.r.n. use of lorazepam. Remains elopement risk, is appropriate for memory care environment, continue medication   Overactive bladder No change in urinary symptoms with removal of oxybutynin. Patient has minimal incontinence.  Gait disorder Long-standing gait instability problem, no clear etiology, memory issues impact safety. Patient is most safe when mobilizing in manual wheelchair. She continues to insist she can ambulate on her own at times. She continues to have falls from time to time    Follow up: Routine or as needed  Elza Sortor T.Padraig Nhan, NP-C 02/03/2013

## 2013-02-06 NOTE — Assessment & Plan Note (Signed)
Recent blood pressure range 104-152/68-90. Continue current medications, recent lab satisfactory

## 2013-02-06 NOTE — Assessment & Plan Note (Signed)
No report of GI symptoms, continue current medication

## 2013-02-06 NOTE — Assessment & Plan Note (Signed)
Long-standing gait instability problem, no clear etiology, memory issues impact safety. Patient is most safe when mobilizing in manual wheelchair. She continues to insist she can ambulate on her own at times. She continues to have falls from time to time

## 2013-02-06 NOTE — Assessment & Plan Note (Addendum)
November 2014 MDS: BIMS 11/15, PHQ-9 0/27. Functional status is unchanged: requires extensive assistance with transfers, toileting, dressing,bathung. Requires limited assistance with bed mobility and walking. Continues to participate in activities of choice on and off the unit. Spouse remains involved, visits often. Does have intermittent problem with anxiety/agitation most often resolve with one-to-one intervention. Does require occasional p.r.n. use of lorazepam. Remains elopement risk, is appropriate for memory care environment, continue medication

## 2013-02-06 NOTE — Assessment & Plan Note (Signed)
No change in urinary symptoms with removal of oxybutynin. Patient has minimal incontinence.

## 2013-05-05 ENCOUNTER — Encounter: Payer: Self-pay | Admitting: Geriatric Medicine

## 2013-05-05 ENCOUNTER — Non-Acute Institutional Stay (SKILLED_NURSING_FACILITY): Payer: Medicare Other | Admitting: Geriatric Medicine

## 2013-05-05 DIAGNOSIS — F0280 Dementia in other diseases classified elsewhere without behavioral disturbance: Secondary | ICD-10-CM

## 2013-05-05 DIAGNOSIS — F32A Depression, unspecified: Secondary | ICD-10-CM

## 2013-05-05 DIAGNOSIS — F329 Major depressive disorder, single episode, unspecified: Secondary | ICD-10-CM

## 2013-05-05 DIAGNOSIS — F411 Generalized anxiety disorder: Secondary | ICD-10-CM

## 2013-05-05 DIAGNOSIS — G309 Alzheimer's disease, unspecified: Secondary | ICD-10-CM

## 2013-05-05 DIAGNOSIS — I10 Essential (primary) hypertension: Secondary | ICD-10-CM

## 2013-05-05 DIAGNOSIS — F3289 Other specified depressive episodes: Secondary | ICD-10-CM

## 2013-05-05 NOTE — Assessment & Plan Note (Addendum)
Less use of p.r.n. Lorazepam since mirtazapine dose increased, Taper off sertraline.

## 2013-05-05 NOTE — Progress Notes (Signed)
Patient ID: Dominique Martin, female   DOB: Oct 04, 1926, 80 y.o.   MRN: [id number]  Dominique Martin [phone]([id number])  Code Status: Living Will, Full Code      Contact Information   Name Relation Home Work Dominique Martin   Dominique Martin [place] [phone]  [phone]   Dominique BushmanSharpe,Dominique Martin   [phone]   Dominique Martin Martin [phone]         Chief Complaint  Patient presents with  . Medical Managment of Chronic Issues    HPI: This is a 80 y.o. female resident of [facility] Memory Care section evaluated today for management of ongoing medical issues.    Last  visit: Hypertension Recent blood pressure range 104-152/68-90. Continue current medications, recent lab satisfactory   GERD (gastroesophageal reflux disease) No report of GI symptoms, continue current medication  Alzheimer'Martin disease  November 2014 MDS: BIMS 11/15, PHQ-9 0/27. Functional status is unchanged: requires extensive assistance with transfers, toileting, dressing,bathung. Requires limited assistance with bed mobility and walking. Continues to participate in activities of choice on and off the unit. Spouse remains involved, visits often. Does have intermittent problem with anxiety/agitation most often resolve with one-to-one intervention. Does require occasional p.r.n. use of lorazepam. Remains elopement risk, is appropriate for memory care environment, continue medication  Overactive bladder No change in urinary symptoms with removal of oxybutynin. Patient has minimal incontinence.  Gait disorder Long-standing gait instability problem, no clear etiology, memory issues impact safety. Patient is most safe when mobilizing in manual wheelchair. She continues to insist she can ambulate on her own at times. She continues to have falls from time to time  Since last visit patient has had no acute medical issues. She has exhibited a change in her sleep schedule; has been recently up at night and  sleeping during the day. Nurse reports her overall anxiety level has decreased; she is less often looking for her mother.  Review of facility record shows very little use of p.r.n. Lorazepam ; one dose in the last 2 weeks. P.o. intake fluctuates, patient has had a weight loss of about 4 pounds in the last 3 months. Vital signs other wise satisfactory. Most recent MDS review 04/24/2013 with a decline in cognitive status. The patient continues to be a fall and elopement risk. No change in functional status.       Allergies  Allergen Reactions  . Aricept [Donepezil Hydrochloride]   . Penicillins   . Tubersol [Tuberculin Ppd]    MEDICATION - Reviewed  DATA REVIEWED  Radiologic exams:       UVO:[id number]:[facility], external 05/27/2012: WBC 7.9, hemoglobin 13.3, hematocrit 39.9, platelets 208   Glucose 76, BUN 17, creatinine 0.77, sodium 140, potassium 4.1. Protein/LFTs WNL   Total cholesterol 158, triglyceride 128, HDL 46, LDL 86   TSH 2.72  Lab Results- [facility] 12/01/2012  Component Value   NA 138   K 4.1   CREATININE 0.9   BUN 16   Glucose 96       TSH 2.18     REVIEW OF SYSTEMS DATA OBTAINED: from patient, nurse, medical record,  GENERAL: Feels "ok"   No fevers, fatigue, change in appetite or weight  SKIN: No itch, rash  EYES: No eye pain, dryness or itching No change in vision  EARS: No earache, change in hearing  NOSE: No congestion, drainage or bleeding  MOUTH/THROAT: No mouth, tooth pain or sore throat No difficulty chewing or swallowing  RESPIRATORY: No cough, wheezing, SOB  CARDIAC: No  chest pain, No edema.  GI: No abdominal pain No N/V/D or constipation No heartburn or reflux  occasional incontinence  GU: No dysuria. Urinary frequency present (not new) No change in urine volume or character occasional incontinence  MUSCULOSKELETAL: No joint pain, swelling or stiffness No back pain No muscle ache, pain, weakness  NEUROLOGIC: No dizziness, fainting, headache,  No change in  mental status (dementia).  PSYCHIATRIC: Intermittent anxiety/ agitaiton Sleeps well.      PHYSICAL EXAM  Filed Vitals:   05/05/13 1154  BP: 102/64  Pulse: 64  Temp: 97.7 F (36.5 C)  Resp: 20  Weight: 134 lb (60.782 kg)  SpO2: 95%  Body mass index is 22.99 kg/(m^2).  GENERAL APPEARANCE: No acute distress, appropriately groomed, normal body habitus. Asleep in WC, awakens easily, less conversant than previously. Cooperative with exam. HEAD: Normocephalic, atraumatic  EYES: Conjunctiva/lids clear. Pupils round, reactive.  EARS: External exam WNL, canals clear, decreased hearing  NOSE: No deformity or discharge.  MOUTH/THROAT: Lips w/o lesions. Oral mucosa, tongue moist, w/o lesion. Oropharynx w/o redness or lesions.  NECK: Supple, full ROM. No thyroid tenderness, enlargement or nodule  LYMPHATICS: No head, neck or supraclavicular adenopathy  RESPIRATORY: Breathing is even, unlabored. Lung sounds are clear and full.  CARDIOVASCULAR: Heart RRR. No murmur or extra heart sounds   EDEMA: No peripheral edema.  GASTROINTESTINAL: Abdomen is soft, non-tender, not distended w/ normal bowel sounds.  MUSCULOSKELETAL: Moves all extremities with full ROM, strength and tone. Back is without kyphosis, scoliosis or spinal process tenderness. Gait is not assessed to day, self propels in [facility] NEUROLOGIC: Not Oriented to time, place.  PSYCHIATRIC: Mood and affect appropriate to situation  ASSESSMENT/PLAN  Generalized anxiety disorder Less use of p.r.n. Lorazepam since mirtazapine dose increased, Taper off sertraline.  Hypertension Weekly BP range 97-14-/61-89, satisfactory. Continue current medication, update lab  Alzheimer'Martin disease Annual MDS 04/24/13: BIMS 7/15, PHQ-9 1/27. No significant change in functional status. Generally accepts assistance with ADLs, does continue to attempt to stand and walk on her own. Continues to be a fall and elopement risk. Patient continues to attend and participate in  some unit activities. Family continues to be involved, spouse visits often  Depression Historically this patient'Martin depressive symptoms including poor appetite, weight loss, increased anxiety and low activity. Patient'Martin p.o. intake does fluctuate day-to-day she has had some mild weight loss. These changes could be due to progression of her Alzheimer'Martin disease as opposed to worsening depression at this point. Continue to monitor, continue mirtazapine.   Follow up: Routine or as needed  Lab/tests:   05/2013: CBC,CMP, TSH  Toribio Harbour, NP-C [facility] [phone]  05/05/2013

## 2013-05-08 NOTE — Assessment & Plan Note (Signed)
Historically this patient's depressive symptoms including poor appetite, weight loss, increased anxiety and low activity. Patient's p.o. intake does fluctuate day-to-day she has had some mild weight loss. These changes could be due to progression of her Alzheimer's disease as opposed to worsening depression at this point. Continue to monitor, continue mirtazapine.

## 2013-05-08 NOTE — Assessment & Plan Note (Signed)
Annual MDS 04/24/13: BIMS 7/15, PHQ-9 1/27. No significant change in functional status. Generally accepts assistance with ADLs, does continue to attempt to stand and walk on her own. Continues to be a fall and elopement risk. Patient continues to attend and participate in some unit activities. Family continues to be involved, spouse visits often

## 2013-05-08 NOTE — Assessment & Plan Note (Signed)
Weekly BP range 97-14-/61-89, satisfactory. Continue current medication, update lab

## 2013-05-28 LAB — BASIC METABOLIC PANEL
BUN: 16 mg/dL (ref 4–21)
Creatinine: 0.7 mg/dL (ref 0.5–1.1)
Glucose: 78 mg/dL
Potassium: 4 mmol/L (ref 3.4–5.3)
Sodium: 141 mmol/L (ref 137–147)

## 2013-05-28 LAB — CBC AND DIFFERENTIAL
HEMATOCRIT: 39 % (ref 36–46)
HEMATOCRIT: 39 % (ref 36–46)
HEMOGLOBIN: 13 g/dL (ref 12.0–16.0)
Hemoglobin: 13 g/dL (ref 12.0–16.0)
PLATELETS: 237 10*3/uL (ref 150–399)
Platelets: 237 10*3/uL (ref 150–399)
WBC: 5.8 10*3/mL
WBC: 5.8 10^3/mL

## 2013-05-28 LAB — TSH: TSH: 1.11 u[IU]/mL (ref 0.41–5.90)

## 2013-05-28 LAB — HEPATIC FUNCTION PANEL
ALK PHOS: 59 U/L (ref 25–125)
ALT: 17 U/L (ref 7–35)
AST: 15 U/L (ref 13–35)

## 2013-06-09 ENCOUNTER — Encounter: Payer: Self-pay | Admitting: Adult Health

## 2013-06-09 ENCOUNTER — Non-Acute Institutional Stay (SKILLED_NURSING_FACILITY): Payer: Medicare Other | Admitting: Adult Health

## 2013-06-09 DIAGNOSIS — N318 Other neuromuscular dysfunction of bladder: Secondary | ICD-10-CM

## 2013-06-09 DIAGNOSIS — R269 Unspecified abnormalities of gait and mobility: Secondary | ICD-10-CM

## 2013-06-09 DIAGNOSIS — N3281 Overactive bladder: Secondary | ICD-10-CM

## 2013-06-09 DIAGNOSIS — F411 Generalized anxiety disorder: Secondary | ICD-10-CM

## 2013-06-09 DIAGNOSIS — F0280 Dementia in other diseases classified elsewhere without behavioral disturbance: Secondary | ICD-10-CM

## 2013-06-09 DIAGNOSIS — G309 Alzheimer's disease, unspecified: Secondary | ICD-10-CM

## 2013-06-09 NOTE — Progress Notes (Signed)
Patient ID: Dominique Martin, female   DOB: 1926/06/13, 80 y.o.   MRN: [id number]   [facility] (31)   Code Status: Full Code, Living will  Chief Complaint  Patient presents with  . Medical Managment of Chronic Issues  . Anxiety  . Hypertension  . Depression    HPI:  This is an 80 y.o. Female pt on memory care at [facility]. She was tapered off sertraline early in March with significant increase in anxiety and use of PRN ativan, so this was restarted 3/23 at 50mg  HS. She has still required 2 doses of PRN ativan in the 2 weeks after restarting, but staff reports marked improvement in anxiety. Most anxiety is when she has to urinate or is trying to get up and walk unassisted. She is continent of bladder and bowel and is toileted frequently. Pt continues to have sleep difficulty and will have nights where she is up and down all night, but this is not new for her, nor does it seem to affect next day functioning/alertness, outside of sleeping a little later. She lost quite a bit of weight from Feb 2015 (145 lb), to March 2015 (134), but has not lost any weight in the last month. She is eating 50-75 percent of most meals, but will occasionally skip breakfast. She consumes 1 boost daily.  No acute concerns reported   Last visit 05/05/13 ASSESSMENT/PLAN  Generalized anxiety disorder  Less use of p.r.n. Lorazepam since mirtazapine dose increased, Taper off sertraline.  Hypertension  Weekly BP range 97-14-/61-89, satisfactory. Continue current medication, update lab  Alzheimer's disease  Annual MDS 04/24/13: BIMS 7/15, PHQ-9 1/27. No significant change in functional status. Generally accepts assistance with ADLs, does continue to attempt to stand and walk on her own. Continues to be a fall and elopement risk. Patient continues to attend and participate in some unit activities. Family continues to be involved, spouse visits often  Depression  Historically this patient's  depressive symptoms including poor appetite, weight loss, increased anxiety and low activity. Patient's p.o. intake does fluctuate day-to-day she has had some mild weight loss. These changes could be due to progression of her Alzheimer's disease as opposed to worsening depression at this point. Continue to monitor, continue mirtazapine.      Allergies  Allergen Reactions  . Aricept [Donepezil Hydrochloride]   . Penicillins   . Tubersol [Tuberculin Ppd]     MEDICATIONS -  Reviewed/updated  DATA REVIEWED  Laboratory Studies: Lab Results  Component Value Date   WBC 5.8 05/28/2013   HGB 13.0 05/28/2013   HCT 39 05/28/2013   MCV 92.5 04/21/2011   PLT 237 05/28/2013    Lab Results  Component Value Date   NA 141 05/28/2013   K 4.0 05/28/2013   GLU 78 05/28/2013   BUN 16 05/28/2013   CREATININE 0.7 05/28/2013   Lab Results  Component Value Date   TSH 1.11 05/28/2013     Lab Results  Component Value Date   CHOL 128 05/28/2012   HDL 46 05/28/2012   LDLCALC 86 05/28/2012      Total Protein 6.3; Albumin 3.9; AST 15; ALT 17; Alk Phos 59  05/28/13  REVIEW OF SYSTEMS  DATA OBTAINED: from patient, nurse, medical record,  GENERAL: Staff reports anxiety improving with sertraline. Pt report feeling "very well"  No fevers, fatigue, No weight change x 1 month  SKIN: No itch, rash  EYES: No eye pain, dryness or itching No change in vision  EARS: No earache, change in hearing  NOSE: No congestion, drainage or bleeding  MOUTH/THROAT: No mouth, tooth pain or sore throat No difficulty chewing or swallowing, " I have a little laryngitis. My voice is a little off." Staff reports this is new, but pt not complaining RESPIRATORY: No cough, wheezing, SOB  CARDIAC: No chest pain, No edema.  GI: No abdominal pain No N/V/D or constipation No heartburn or reflux occasional incontinence  GU: No dysuria. Urinary frequency present (not new) No change in urine volume or character occasional incontinence  MUSCULOSKELETAL:  No joint pain, swelling or stiffness No back pain No muscle ache, pain, weakness  NEUROLOGIC: No dizziness, fainting, headache, No change in mental status (dementia). PSYCHIATRIC: Intermittent anxiety/ agitaiton improving. Pt with difficult sleeping behavior every 2-3 weeks she will have frequent awakenings and require attn     PHYSICAL EXAM Filed Vitals:   06/09/13 1220  BP: 114/74  Pulse: 66  Temp: 97.1 F (36.2 C)  Resp: 20  Weight: 134 lb (60.782 kg)  SpO2: 97%   Body mass index is 22.99 kg/(m^2).  GENERAL APPEARANCE: No acute distress, appropriately groomed, normal body habitus. Alert in wheelchair after unit activity. She is attempting to get up from wheelchair unassisted, but cannot figure out how. She was easily distracted/redirected when I approached her. Cooperative with exam.  HEAD: Normocephalic, atraumatic  EYES: Conjunctiva/lids clear. Pupils round, reactive.  EARS: External exam WNL, canals clear, decreased hearing  NOSE: No deformity or discharge.  MOUTH/THROAT: Lips w/o lesions. Oral mucosa, tongue moist, w/o lesion. Oropharynx w/o redness or lesions. Voice is slightly hoarse. Throat clearing frequently when talking NECK: Supple, full ROM. No thyroid tenderness, enlargement or nodule  LYMPHATICS: No head, neck or supraclavicular adenopathy  RESPIRATORY: Breathing is even, unlabored. Lung sounds are clear and full.  CARDIOVASCULAR: Heart RRR. No murmur or extra heart sounds  EDEMA: No peripheral edema.  GASTROINTESTINAL: Abdomen is soft, non-tender, not distended w/ normal bowel sounds.  MUSCULOSKELETAL: Moves all extremities with full ROM, strength and tone. Back is without kyphosis, scoliosis or spinal process tenderness. Gait is not assessed to day, self propels in [facility]  NEUROLOGIC: Not Oriented to time, place.  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 Anxiety significantly increased and 2 prn doses ativan required after taper. Sertraline restarted at 50mg  HS 05/18/13. Staff reports effective in decreasing anxiety. Follow  Gait disorder No recent falls. She has long-standing gait instability that is further impacted by her poor judgement r/t AD. She frequently tries to get up from wheelchair unassisted. She requires a seatbelt in wheelchair and can self propel. Staff ambulates pt frequently with gait belt, walker and assistance. Requires close supervision for safety  Overactive bladder Patient has continued to have stable urinary condition following d/c mirbitrique several months ago. Remains continent of urine. Does not wear protective under clothes. Is toileted frequently  Alzheimer's disease Continues to participate and benefit from unit activities on memory care. Is interactive with other residents. Becomes anxious with confusion at times, but this is improving with restart of sertraline. Requires significant assistance with ADLs. Continue current medications   Family/ staff Communication:  Continue to write progress notes regarding anxiety level/surrounding events and use of prn ativan   Follow up: Return for Routine/as needed.  Margrit Minner T.Carolos Fecher, NP-C/Stephanie Angela [facility] [phone]  [phone]  06/09/2013

## 2013-06-09 NOTE — Assessment & Plan Note (Signed)
Patient has continued to have stable urinary condition following d/c mirbitrique several months ago. Remains continent of urine. Does not wear protective under clothes. Is toileted frequently

## 2013-06-09 NOTE — Assessment & Plan Note (Signed)
Failed sertraline taper. Anxiety significantly increased and 2 prn doses ativan required after taper. Sertraline restarted at 50mg  HS 05/18/13. Staff reports effective in decreasing anxiety. Follow

## 2013-06-09 NOTE — Assessment & Plan Note (Signed)
No recent falls. She has long-standing gait instability that is further impacted by her poor judgement r/t AD. She frequently tries to get up from wheelchair unassisted. She requires a seatbelt in wheelchair and can self propel. Staff ambulates pt frequently with gait belt, walker and assistance. Requires close supervision for safety

## 2013-06-09 NOTE — Assessment & Plan Note (Addendum)
Continues to participate and benefit from unit activities on memory care. Is interactive with other residents. Becomes anxious with confusion at times, but this is improving with restart of sertraline. Requires significant assistance with ADLs. Continue current medications

## 2013-08-04 ENCOUNTER — Non-Acute Institutional Stay (SKILLED_NURSING_FACILITY): Payer: Medicare Other | Admitting: Geriatric Medicine

## 2013-08-04 ENCOUNTER — Encounter: Payer: Self-pay | Admitting: Geriatric Medicine

## 2013-08-04 DIAGNOSIS — F32A Depression, unspecified: Secondary | ICD-10-CM

## 2013-08-04 DIAGNOSIS — I10 Essential (primary) hypertension: Secondary | ICD-10-CM

## 2013-08-04 DIAGNOSIS — G309 Alzheimer's disease, unspecified: Secondary | ICD-10-CM

## 2013-08-04 DIAGNOSIS — F3289 Other specified depressive episodes: Secondary | ICD-10-CM

## 2013-08-04 DIAGNOSIS — F411 Generalized anxiety disorder: Secondary | ICD-10-CM

## 2013-08-04 DIAGNOSIS — F0280 Dementia in other diseases classified elsewhere without behavioral disturbance: Secondary | ICD-10-CM

## 2013-08-04 DIAGNOSIS — F329 Major depressive disorder, single episode, unspecified: Secondary | ICD-10-CM

## 2013-08-04 NOTE — Assessment & Plan Note (Signed)
Continues to need prn ativan. Continue meds at this time and monitor behavior.

## 2013-08-04 NOTE — Assessment & Plan Note (Addendum)
Recent BP range 111--119/73-86, stop amlodipine, monitor BP daily for 2 weeks.

## 2013-08-04 NOTE — Assessment & Plan Note (Signed)
Continue Namenda. She still may be benefiting from this med. The goal is to retain her functional status as long as possible.

## 2013-08-04 NOTE — Assessment & Plan Note (Signed)
Weight has decreased over time but stable over the past month. Continue meds and monitor.

## 2013-08-04 NOTE — Progress Notes (Signed)
Patient ID: Dominique Martin, female   DOB: 10/07/1926, 80 y.o.   MRN: [id number]   [facility] (31)   Code Status: Full Code, Living will  Chief Complaint  Patient presents with  . Medical Management of Chronic Issues    HPI:  This is an 80 y.o. Female pt on memory care at [facility]. There are no new complaints today per the staff. The resident reported some abd pain in the lower abd upon my visit which is new for her. She had a large bowel movement last night and denies nausea, fever, or diarrhea. The staff has not noted any issues regarding this.  Her weight has decreased over time to 139 lbs, was 145lbs in Feb (2015). She has had some functional decline over time (see below).   General Anxiety Disorder Continues to have occasional falls. Staff reports that she can become anxious wandering in the WC looking for her husband. She receives ativan prn on occasion for this reason. Currently on Zoloft and Remeron. Attempts to taper the Zoloft(last time 04/2013) have been unsuccessful  Hypertension  Weekly BP range 111-119/73-86, satisfactory.On metoprolol and norvasc. Tolerating well. Alzheimer's disease  Annual MDS 07/24/13: BIMS 10/15, PHQ-9 1/27. Currently on Namenda. Staff reports that she uses a WC and propels her self this way but continues to attempt to get up without help. Continues to be a fall and elopement risk. Patient continues to attend and participate in some unit activities.  Diet changed to finger foods recently because she is having trouble using utensils. Depression  Historically this patient's depressive symptoms including poor appetite, weight loss, increased anxiety and low activity. Patient's p.o. intake does fluctuate day-to-day she has had some mild weight loss. These changes could be due to progression of her Alzheimer's disease as opposed to worsening depression at this point.      Allergies  Allergen Reactions  . Aricept [Donepezil  Hydrochloride]   . Penicillins   . Tubersol [Tuberculin Ppd]     MEDICATIONS -  Reviewed/updated  DATA REVIEWED  Laboratory Studies: Lab Results  Component Value Date   WBC 5.8 05/28/2013   HGB 13.0 05/28/2013   HCT 39 05/28/2013   MCV 92.5 04/21/2011   PLT 237 05/28/2013    Lab Results  Component Value Date   NA 141 05/28/2013   K 4.0 05/28/2013   GLU 78 05/28/2013   BUN 16 05/28/2013   CREATININE 0.7 05/28/2013   Lab Results  Component Value Date   TSH 1.11 05/28/2013     Lab Results  Component Value Date   CHOL 128 05/28/2012   HDL 46 05/28/2012   LDLCALC 86 05/28/2012   Total Protein 6.3; Albumin 3.9; AST 15; ALT 17; Alk Phos 59  05/28/13  REVIEW OF SYSTEMS  DATA OBTAINED: from patient, nurse, medical record,  GENERAL:Feels well, no weight change this month. Difficulty eating with utensils. SKIN: No itch, rash  EYES: No eye pain, dryness or itching No change in vision  EARS: No earache, change in hearing  NOSE: No congestion, drainage or bleeding  MOUTH/THROAT: No mouth, tooth pain or sore throat No difficulty chewing or swallowing,  RESPIRATORY: No cough, wheezing, SOB  CARDIAC: No chest pain, No edema.  GI: Reports lower mid abdominal pain today. No N/V/D or constipation No heartburn or reflux occasional incontinence  GU: No dysuria. Urinary frequency present (not new) No change in urine volume or character occasional incontinence  MUSCULOSKELETAL: No joint pain, swelling or stiffness No back   Patient ID: Dominique Martin, female   DOB: [birth year], 80 y.o.   MRN: [id number]   [facility] (31)   Code Status: Full Code, Living will  Chief Complaint  Patient presents with  . Medical Management of Chronic Issues    HPI:  This is an 80 y.o. Female pt on memory care at [facility]. There are no new complaints today per the staff. The resident reported some abd pain in the lower abd upon my visit which is new for her. She had a large bowel movement last night and denies nausea, fever, or diarrhea. The staff has not noted any issues regarding this.  Her weight has decreased over time to 139 lbs, was 145lbs in Feb (2015). She has had some functional decline over time (see below).   General Anxiety Disorder Continues to have occasional falls. Staff reports that she can become anxious wandering in the [facility] looking for her husband. She receives ativan prn on occasion for this reason. Currently on Zoloft and Remeron. Attempts to taper the Zoloft(last time 04/2013) have been unsuccessful  Hypertension  Weekly BP range 111-119/73-86, satisfactory.On metoprolol and norvasc. Tolerating well. Alzheimer's disease  Annual MDS 07/24/13: BIMS 10/15, PHQ-9 1/27. Currently on Namenda. Staff reports that she uses a WC and propels her self this way but continues to attempt to get up without help. Continues to be a fall and elopement risk. Patient continues to attend and participate in some unit activities.  Diet changed to finger foods recently because she is having trouble using utensils. Depression  Historically this patient's depressive symptoms including poor appetite, weight loss, increased anxiety and low activity. Patient's p.o. intake does fluctuate day-to-day she has had some mild weight loss. These changes could be due to progression of her Alzheimer's disease as opposed to worsening depression at this point.      Allergies  Allergen Reactions  . Aricept [Donepezil  Hydrochloride]   . Penicillins   . Tubersol [Tuberculin Ppd]     MEDICATIONS -  Reviewed/updated  DATA REVIEWED  Laboratory Studies: Lab Results  Component Value Date   WBC 5.8 05/28/2013   HGB 13.0 05/28/2013   HCT 39 05/28/2013   MCV 92.5 04/21/2011   PLT 237 05/28/2013    Lab Results  Component Value Date   NA 141 05/28/2013   K 4.0 05/28/2013   GLU 78 05/28/2013   BUN 16 05/28/2013   CREATININE 0.7 05/28/2013   Lab Results  Component Value Date   TSH 1.11 05/28/2013     Lab Results  Component Value Date   CHOL 128 05/28/2012   HDL 46 05/28/2012   LDLCALC 86 05/28/2012   Total Protein 6.3; Albumin 3.9; AST 15; ALT 17; Alk Phos 59  05/28/13  REVIEW OF SYSTEMS  DATA OBTAINED: from patient, nurse, medical record,  GENERAL:Feels well, no weight change this month. Difficulty eating with utensils. SKIN: No itch, rash  EYES: No eye pain, dryness or itching No change in vision  EARS: No earache, change in hearing  NOSE: No congestion, drainage or bleeding  MOUTH/THROAT: No mouth, tooth pain or sore throat No difficulty chewing or swallowing,  RESPIRATORY: No cough, wheezing, SOB  CARDIAC: No chest pain, No edema.  GI: Reports lower mid abdominal pain today. No N/V/D or constipation No heartburn or reflux occasional incontinence  GU: No dysuria. Urinary frequency present (not new) No change in urine volume or character occasional incontinence  MUSCULOSKELETAL: No joint pain, swelling or stiffness No back

## 2013-09-29 ENCOUNTER — Non-Acute Institutional Stay (SKILLED_NURSING_FACILITY): Payer: Medicare Other | Admitting: Nurse Practitioner

## 2013-09-29 DIAGNOSIS — F32A Depression, unspecified: Secondary | ICD-10-CM

## 2013-09-29 DIAGNOSIS — I10 Essential (primary) hypertension: Secondary | ICD-10-CM

## 2013-09-29 DIAGNOSIS — K219 Gastro-esophageal reflux disease without esophagitis: Secondary | ICD-10-CM

## 2013-09-29 DIAGNOSIS — F0280 Dementia in other diseases classified elsewhere without behavioral disturbance: Secondary | ICD-10-CM

## 2013-09-29 DIAGNOSIS — G309 Alzheimer's disease, unspecified: Secondary | ICD-10-CM

## 2013-09-29 DIAGNOSIS — F329 Major depressive disorder, single episode, unspecified: Secondary | ICD-10-CM

## 2013-09-29 DIAGNOSIS — F3289 Other specified depressive episodes: Secondary | ICD-10-CM

## 2013-09-29 NOTE — Progress Notes (Signed)
Patient ID: Dominique Martin, female   DOB: November 13, 1926, 80 y.o.   MRN: [id number] .   [facility] (31)   Code Status: Full Code, Living will  Chief Complaint  Patient presents with  . Medical Management of Chronic Issues    HPI:  This is an 80 y.o. Female pt on memory care at [facility]. Pt with PMH of anxeity, htn, dementia, vit d def who is being seen today for routine follow up on chronic conditions. There are no new complaints today per the staff and pt has no complaints. Last visit pt was taking off of norvasc due to low bp. bp has remained within appropriate range.     Allergies  Allergen Reactions  . Aricept [Donepezil Hydrochloride]   . Penicillins   . Tubersol [Tuberculin Ppd]     MEDICATIONS Current Outpatient Prescriptions on File Prior to Visit  Medication Sig Dispense Refill  . acetaminophen (TYLENOL) 500 MG tablet Take 500 mg by mouth every 6 (six) hours as needed. Take 2 tablets (1098m)  twice a day      . amLODipine (NORVASC) 2.5 MG tablet Take 2.5 mg by mouth daily. Take 1 daily to treat high BP.      .Marland KitchenCalcium Carbonate-Vitamin D (CALCIUM 600+D3) 600-400 MG-UNIT per tablet Take 1 tablet by mouth 2 (two) times daily. Take one twice daily for bone/muscle health.(8pm)      . Carboxymethylcellulose Sodium 0.25 % SOLN Apply 1 drop to eye. Left eye  twice daily to reduce irritation      . docusate sodium (COLACE) 100 MG capsule Take 100 mg by mouth daily.       .Marland Kitchenlactose free nutrition (BOOST PLUS) LIQD Take 237 mLs by mouth daily. One serving daily      . LORazepam (ATIVAN) 0.5 MG tablet Take one tablet every 8 hours as needed for agitation  90 tablet  5  . Memantine HCl ER (NAMENDA XR) 28 MG CP24 Take by mouth. Take one tablet in morning for memory      . metoprolol tartrate (LOPRESSOR) 12.5 mg TABS Take 0.5 tablets (12.5 mg total) by mouth 2 (two) times daily.      . mirtazapine (REMERON) 30 MG tablet Take 30 mg by mouth at bedtime.      .Marland Kitchen omeprazole (PRILOSEC) 20 MG capsule Take 20 mg by mouth daily. Take one daily to reduce stomach acid.      .Marland Kitchensertraline (ZOLOFT) 50 MG tablet Take 50 mg by mouth at bedtime.       . Vitamin D, Ergocalciferol, (DRISDOL) 50000 UNITS CAPS Take 50,000 Units by mouth every 7 (seven) days. On Friday       No current facility-administered medications on file prior to visit.     DATA REVIEWED  Laboratory Studies: Lab Results  Component Value Date   WBC 5.8 05/28/2013   HGB 13.0 05/28/2013   HCT 39 05/28/2013   MCV 92.5 04/21/2011   PLT 237 05/28/2013    Lab Results  Component Value Date   NA 141 05/28/2013   K 4.0 05/28/2013   GLU 78 05/28/2013   BUN 16 05/28/2013   CREATININE 0.7 05/28/2013   Lab Results  Component Value Date   TSH 1.11 05/28/2013     Lab Results  Component Value Date   CHOL 128 05/28/2012   HDL 46 05/28/2012   LDLCALC 86 05/28/2012   Total Protein 6.3; Albumin 3.9; AST 15; ALT 17; Alk Phos 59  05/28/13  REVIEW OF SYSTEMS  DATA OBTAINED: from patient, nurse, medical record,  GENERAL:Feels well, no weight change this month. Difficulty eating with utensils. SKIN: No itch, rash   MOUTH/THROAT:No difficulty chewing or swallowing,  RESPIRATORY: No cough, wheezing, SOB  CARDIAC: No chest pain, No edema.  GI:no abdominal discomfort.  No N/V/D or constipation No heartburn or reflux occasional incontinence  GU: No dysuria. Urinary frequency present (not new) No change in urine volume or character occasional incontinence  MUSCULOSKELETAL: No joint pain, swelling or stiffness No back pain No muscle ache, pain, weakness  NEUROLOGIC: No dizziness, fainting, headache, No change in mental status (dementia). PSYCHIATRIC: Intermittent anxiety/ agitaiton stable. Still requires prn ativan at times.    PHYSICAL EXAM Filed Vitals:   09/29/13 1446  BP: 119/70  Pulse: 68  Temp: 97.1 F (36.2 C)  Resp: 20  Weight: 140 lb (63.504 kg)   Body mass index is 24.02 kg/(m^2).  GENERAL APPEARANCE: No  acute distress, appropriately groomed, normal body habitus. Alert in wheelchair  HEAD: Normocephalic, atraumatic  EYES: Conjunctiva/lids clear. Pupils round, reactive.  EARS: External exam WNL, decreased hearing  NOSE: No deformity or discharge.  MOUTH/THROAT: Lips w/o lesions. Oral mucosa, tongue moist, w/o lesion. Oropharynx w/o redness or lesions.  NECK: Supple, full ROM. No thyroid tenderness, enlargement or nodule  RESPIRATORY: Breathing is even, unlabored. Lung sounds are CTA CARDIOVASCULAR: Heart RRR. No murmur or extra heart sounds  EDEMA: No peripheral edema.  GASTROINTESTINAL: Abdomen is soft, non-tender, not distended w/ normal bowel sounds.  MUSCULOSKELETAL: Moves all extremities with full ROM, strength and tone. Back is without kyphosis, scoliosis or spinal process tenderness. Gait is not assessed to day, self propels in [facility]  NEUROLOGIC: Not Oriented to time, place or situation. Pleasantly confused.    ASSESSMENT/PLAN   1. Essential hypertension Blood pressure has been stable off norvasc, conts on metoprolol   2. Gastroesophageal reflux disease without esophagitis -conts on prilosec, without complaints  3. Depression -stable, without behaviors or signs of worsening depression  4. Alzheimer's disease -has remained stable, no acute changes in cognition or functional status.

## 2013-11-13 ENCOUNTER — Encounter: Payer: Self-pay | Admitting: Internal Medicine

## 2013-12-01 ENCOUNTER — Non-Acute Institutional Stay (SKILLED_NURSING_FACILITY): Payer: Medicare Other | Admitting: Nurse Practitioner

## 2013-12-01 DIAGNOSIS — F329 Major depressive disorder, single episode, unspecified: Secondary | ICD-10-CM

## 2013-12-01 DIAGNOSIS — G309 Alzheimer's disease, unspecified: Secondary | ICD-10-CM

## 2013-12-01 DIAGNOSIS — F418 Other specified anxiety disorders: Secondary | ICD-10-CM

## 2013-12-01 DIAGNOSIS — I10 Essential (primary) hypertension: Secondary | ICD-10-CM

## 2013-12-01 DIAGNOSIS — F0280 Dementia in other diseases classified elsewhere without behavioral disturbance: Secondary | ICD-10-CM

## 2013-12-01 DIAGNOSIS — K219 Gastro-esophageal reflux disease without esophagitis: Secondary | ICD-10-CM

## 2013-12-01 DIAGNOSIS — F419 Anxiety disorder, unspecified: Secondary | ICD-10-CM

## 2013-12-01 NOTE — Progress Notes (Signed)
Patient ID: Dominique Martin, female   DOB: April 23, 1926, 80 y.o.   MRN: [id number]    Nursing Home Location:  [facility]   Place of Service: SNF (31)  PCP: Kimber Relic, MD  Allergies  Allergen Reactions  . Aricept [Donepezil Hydrochloride]   . Penicillins   . Tubersol [Tuberculin Ppd]     Chief Complaint  Patient presents with  . Acute Visit    HPI:  This is an 80 y.o. Female pt on memory care at [facility]. Pt with PMH of anxeity, htn, dementia, vit d def who is being seen today for routine follow up on chronic conditions and for medication review for family due to increase lethargy during the day.  Nursing reports this is not new and she is frequently up at night and sleeps during the day then is very combative when trying to be redirected.  Some days she is up more than others but family witnessed her sleeping and would like to try to reduce medications that may be causing this (if this is the cause) pt does have hx of anxiety and agitation so they would like her to cont getting her ativan as needed.  Review of Systems:  Review of Systems  Unable to perform ROS: Dementia    Past Medical History  Diagnosis Date  . Vitamin D deficiency   . Hypercholesterolemia   . Alzheimer disease 2013  . Depression 2013  . BPPV (benign paroxysmal positional vertigo)   . Diverticulosis of colon (without mention of hemorrhage) 08/14/2011    Also ischemic colitis  . GERD (gastroesophageal reflux disease)   . Tremor, essential 2004  . Gait disorder   . Herpes zoster 2006  . Hypertension   . Osteoporosis     tx w/ bisphosphonate 2007-2013  . Pulmonary nodule/lesion, solitary 03/2011    5mm RUL nodule  . Vertebral compression fracture 03/2011    L1  . Vitamin D deficiency   . Herpes zoster without mention of complication 08/14/2011  . Anxiety state, unspecified 07/03/2011  . Urinary frequency 07/03/2011  . Chest pain, unspecified 05/21/2011  . External  hemorrhoids without mention of complication 05/08/2011  . Migraine without aura, without mention of intractable migraine with status migrainosus 04/29/2011  . Pathologic fracture of vertebrae 04/29/2011   Past Surgical History  Procedure Laterality Date  . Bilateral salpingoophorectomy    . Carpal tunnel release    . Colonoscopy  01/20/2004    Dr. Lina Sar   Social History:   reports that she quit smoking about 33 years ago. She has never used smokeless tobacco. She reports that she does not drink alcohol or use illicit drugs.  No family history on file.  Medications: Patient's Medications  New Prescriptions   No medications on file  Previous Medications   ACETAMINOPHEN (TYLENOL) 500 MG TABLET    Take 500 mg by mouth every 6 (six) hours as needed. Take 2 tablets (1000mg )  twice a day   CARBOXYMETHYLCELLULOSE SODIUM 0.25 % SOLN    Apply 1 drop to eye. Left eye  twice daily to reduce irritation   DOCUSATE SODIUM (COLACE) 100 MG CAPSULE    Take 100 mg by mouth daily.    LACTOSE FREE NUTRITION (BOOST PLUS) LIQD    Take 237 mLs by mouth daily. One serving daily   LORAZEPAM (ATIVAN) 0.5 MG TABLET    Take one tablet every 8 hours as needed for agitation   MEMANTINE HCL ER (NAMENDA XR) 28 MG  CP24    Take by mouth. Take one tablet in morning for memory   METOPROLOL TARTRATE (LOPRESSOR) 12.5 MG TABS    Take 0.5 tablets (12.5 mg total) by mouth 2 (two) times daily.   MIRTAZAPINE (REMERON) 30 MG TABLET    Take 30 mg by mouth at bedtime.   OMEPRAZOLE (PRILOSEC) 20 MG CAPSULE    Take 20 mg by mouth daily. Take one daily to reduce stomach acid.   SERTRALINE (ZOLOFT) 50 MG TABLET    Take 50 mg by mouth at bedtime.   Modified Medications   No medications on file  Discontinued Medications   No medications on file     Physical Exam: Filed Vitals:   12/01/13 1549  BP: 112/75  Pulse: 78  Temp: 97.4 F (36.3 C)  Resp: 16  Weight: 141 lb (63.957 kg)    Physical Exam  Constitutional: She  appears well-developed and well-nourished. No distress.  HENT:  Head: Normocephalic and atraumatic.  Mouth/Throat: Oropharynx is clear and moist. No oropharyngeal exudate.  Eyes: Conjunctivae are normal. Pupils are equal, round, and reactive to light.  Neck: Normal range of motion. Neck supple.  Cardiovascular: Normal rate, regular rhythm and normal heart sounds.   Pulmonary/Chest: Effort normal and breath sounds normal.  Abdominal: Soft. Bowel sounds are normal.  Musculoskeletal: She exhibits no edema and no tenderness.  Neurological: She is alert.  Skin: Skin is warm and dry. She is not diaphoretic.  Psychiatric: She has a normal mood and affect.    Labs reviewed: Basic Metabolic Panel:  Recent Labs  16/11/9602/02/15  NA 141  K 4.0  BUN 16  CREATININE 0.7   Liver Function Tests:  Recent Labs  05/28/13  AST 15  ALT 17  ALKPHOS 59   No results found for this basename: LIPASE, AMYLASE,  in the last 8760 hours No results found for this basename: AMMONIA,  in the last 8760 hours CBC:  Recent Labs  05/28/13  WBC 5.8  HGB 13.0  HCT 39  PLT 237   TSH:  Recent Labs  05/28/13  TSH 1.11   A1C: Lab Results  Component Value Date   HGBA1C 6.1* 04/21/2011     Assessment/Plan 1. Alzheimer's disease Advanced disease with behaviors -pt was seen by psych at one time but does not cont to follow up with them -conts on namenda.  -now sleeping during the day, family wants to reduce medications that may cause sleepiness; in reviewing medications will attempt to reduce remeron however this could cause pt to be more lethargic. If so discussed stopping medication all together and would need a titration off -will reduce remeron to 15 mg qhs and staff to provide on-going monitoring -will cont on namenda   2. Depression and anxiety  -will decrease Remeron due to potential for side effect of lethargy  -to cont on zoloft and ativan as needed   3. Hypertension -stable at this time,  conts on lopressor   4. GERD -conts on omeprazole

## 2013-12-05 ENCOUNTER — Emergency Department (HOSPITAL_COMMUNITY): Payer: Medicare Other

## 2013-12-05 ENCOUNTER — Encounter (HOSPITAL_COMMUNITY): Payer: Self-pay | Admitting: Emergency Medicine

## 2013-12-05 ENCOUNTER — Emergency Department (HOSPITAL_COMMUNITY)
Admission: EM | Admit: 2013-12-05 | Discharge: 2013-12-05 | Disposition: A | Discharge status: Home / Self Care | Payer: Medicare Other | Attending: Emergency Medicine | Admitting: Emergency Medicine

## 2013-12-05 DIAGNOSIS — K219 Gastro-esophageal reflux disease without esophagitis: Secondary | ICD-10-CM | POA: Diagnosis not present

## 2013-12-05 DIAGNOSIS — Z79899 Other long term (current) drug therapy: Secondary | ICD-10-CM | POA: Insufficient documentation

## 2013-12-05 DIAGNOSIS — F329 Major depressive disorder, single episode, unspecified: Secondary | ICD-10-CM | POA: Diagnosis not present

## 2013-12-05 DIAGNOSIS — S5012XA Contusion of left forearm, initial encounter: Secondary | ICD-10-CM | POA: Diagnosis not present

## 2013-12-05 DIAGNOSIS — S59912A Unspecified injury of left forearm, initial encounter: Secondary | ICD-10-CM | POA: Diagnosis present

## 2013-12-05 DIAGNOSIS — W1830XA Fall on same level, unspecified, initial encounter: Secondary | ICD-10-CM | POA: Insufficient documentation

## 2013-12-05 DIAGNOSIS — I10 Essential (primary) hypertension: Secondary | ICD-10-CM | POA: Insufficient documentation

## 2013-12-05 DIAGNOSIS — Y929 Unspecified place or not applicable: Secondary | ICD-10-CM | POA: Diagnosis not present

## 2013-12-05 DIAGNOSIS — Z8619 Personal history of other infectious and parasitic diseases: Secondary | ICD-10-CM | POA: Diagnosis not present

## 2013-12-05 DIAGNOSIS — G309 Alzheimer's disease, unspecified: Secondary | ICD-10-CM | POA: Insufficient documentation

## 2013-12-05 DIAGNOSIS — Z8781 Personal history of (healed) traumatic fracture: Secondary | ICD-10-CM | POA: Insufficient documentation

## 2013-12-05 DIAGNOSIS — Z880 Allergy status to penicillin: Secondary | ICD-10-CM | POA: Diagnosis not present

## 2013-12-05 DIAGNOSIS — F0390 Unspecified dementia without behavioral disturbance: Secondary | ICD-10-CM

## 2013-12-05 DIAGNOSIS — Z8639 Personal history of other endocrine, nutritional and metabolic disease: Secondary | ICD-10-CM | POA: Insufficient documentation

## 2013-12-05 DIAGNOSIS — F0280 Dementia in other diseases classified elsewhere without behavioral disturbance: Secondary | ICD-10-CM | POA: Diagnosis not present

## 2013-12-05 DIAGNOSIS — Z8739 Personal history of other diseases of the musculoskeletal system and connective tissue: Secondary | ICD-10-CM | POA: Insufficient documentation

## 2013-12-05 DIAGNOSIS — Y939 Activity, unspecified: Secondary | ICD-10-CM | POA: Insufficient documentation

## 2013-12-05 DIAGNOSIS — Z87891 Personal history of nicotine dependence: Secondary | ICD-10-CM | POA: Diagnosis not present

## 2013-12-05 DIAGNOSIS — W19XXXA Unspecified fall, initial encounter: Secondary | ICD-10-CM

## 2013-12-05 NOTE — ED Provider Notes (Signed)
CSN: [id number]     Arrival date & time 12/05/13  [id number] History   First MD Initiated Contact with Patient 12/05/13 [phone]     Chief Complaint  Patient presents with  . Fall     (Consider location/radiation/quality/duration/timing/severity/associated sxs/prior Treatment) Patient is a 80 y.o. female presenting with fall. The history is provided by the EMS personnel and the nursing home. The history is limited by the condition of the patient.  Fall   patient brought in by EMS from [facility]. Patient has a history of dementia. 7 memory care unit. Patient had a fall earlier nursing station was observing her during neuro checks. They noted that she suddenly stopped talking. She was sent in for concerns for possible head injury from the fall there was no other concerns. There was also concern possible for stroke. Upon arrival here patient is verbal is following commands. Patient seems to be baseline on her mental status. Will do head CT to rule out any intracranial injury.  Past Medical History  Diagnosis Date  . Vitamin D deficiency   . Hypercholesterolemia   . Alzheimer disease 2013  . Depression 2013  . BPPV (benign paroxysmal positional vertigo)   . Diverticulosis of colon (without mention of hemorrhage) 08/14/2011    Also ischemic colitis  . GERD (gastroesophageal reflux disease)   . Tremor, essential 2004  . Gait disorder   . Herpes zoster 2006  . Hypertension   . Osteoporosis     tx w/ bisphosphonate 2007-2013  . Pulmonary nodule/lesion, solitary 03/2011    5mm RUL nodule  . Vertebral compression fracture 03/2011    L1  . Vitamin D deficiency   . Herpes zoster without mention of complication 08/14/2011  . Anxiety state, unspecified 07/03/2011  . Urinary frequency 07/03/2011  . Chest pain, unspecified 05/21/2011  . External hemorrhoids without mention of complication 05/08/2011  . Migraine without aura, without mention of intractable migraine with status  migrainosus 04/29/2011  . Pathologic fracture of vertebrae 04/29/2011   Past Surgical History  Procedure Laterality Date  . Bilateral salpingoophorectomy    . Carpal tunnel release    . Colonoscopy  01/20/2004    Dr. Lina Sarora Brodie   History reviewed. No pertinent family history. History  Substance Use Topics  . Smoking status: Former Smoker    Quit date: 04/20/1980  . Smokeless tobacco: Never Used  . Alcohol Use: No   OB History   Grav Para Term Preterm Abortions TAB SAB Ect Mult Living                 Review of Systems  Unable to perform ROS  level V caveat applies due to the patient's history of dementia. She is alert but confused    Allergies  Aricept; Penicillins; and Tubersol  Home Medications   Prior to Admission medications   Medication Sig Start Date End Date Taking? Authorizing Provider  acetaminophen (TYLENOL) 500 MG tablet Take 500 mg by mouth every 6 (six) hours as needed. Take 2 tablets (1000mg )  twice a day   Yes Historical Provider, MD  Carboxymethylcellulose Sodium 0.25 % SOLN Apply 1 drop to eye 2 (two) times daily.    Yes Historical Provider, MD  docusate sodium (COLACE) 100 MG capsule Take 100 mg by mouth daily.    Yes Historical Provider, MD  lactose free nutrition (BOOST PLUS) LIQD Take 237 mLs by mouth daily. One serving daily   Yes Claudette T Nils FlackKrell, NP  LORazepam (ATIVAN) 0.5 MG  tablet Take one tablet every 8 hours as needed for agitation 05/15/12  Yes Mahima Pandey, MD  Memantine HCl ER (NAMENDA XR) 28 MG CP24 Take 1 capsule by mouth every morning.    Yes Historical Provider, MD  metoprolol tartrate (LOPRESSOR) 12.5 mg TABS Take 0.5 tablets (12.5 mg total) by mouth 2 (two) times daily. 07/22/12  Yes Claudette Royston Sinner Krell, NP  mirtazapine (REMERON) 30 MG tablet Take 30 mg by mouth at bedtime.   Yes Historical Provider, MD  omeprazole (PRILOSEC) 20 MG capsule Take 20 mg by mouth daily. Take one daily to reduce stomach acid.   Yes Historical Provider, MD   sertraline (ZOLOFT) 50 MG tablet Take 50 mg by mouth at bedtime.    Yes Historical Provider, MD   BP 157/88  Pulse 89  Temp(Src) 98 F (36.7 C) (Oral)  Resp 22  SpO2 95% Physical Exam  Nursing note and vitals reviewed. Constitutional: She appears well-developed and well-nourished. No distress.  HENT:  Head: Normocephalic and atraumatic.  Mouth/Throat: Oropharynx is clear and moist.  Eyes: Conjunctivae and EOM are normal. Pupils are equal, round, and reactive to light.  Neck: Normal range of motion.  Cardiovascular: Normal rate, regular rhythm and normal heart sounds.   No murmur heard. Pulmonary/Chest: Effort normal and breath sounds normal. No respiratory distress.  Abdominal: Soft. Bowel sounds are normal. There is no tenderness.  Musculoskeletal: Normal range of motion. She exhibits no tenderness.  Neurological: She is alert. No cranial nerve deficit.  Skin: Skin is warm. No rash noted.  Small area of ecchymosis to the left forearm measuring about 1 x 3 cm.    ED Course  Procedures (including critical care time) Labs Review Labs Reviewed - No data to display  Imaging Review Ct Head Wo Contrast  12/05/2013   CLINICAL DATA:  Fall at nursing home.  EXAM: CT HEAD WITHOUT CONTRAST  TECHNIQUE: Contiguous axial images were obtained from the base of the skull through the vertex without intravenous contrast.  COMPARISON:  07/19/2012  FINDINGS: The brain demonstrates stable cortical atrophy and small vessel disease. The brain demonstrates no evidence of hemorrhage, infarction, edema, mass effect, extra-axial fluid collection, hydrocephalus or mass lesion. The skull is unremarkable.  IMPRESSION: No acute findings.  Stable atrophy and small vessel disease.   Electronically Signed   By: Irish LackGlenn  Yamagata M.D.   On: 12/05/2013 08:01     EKG Interpretation   Date/Time:  Saturday December 05 2013 06:50:43 EDT Ventricular Rate:  89 PR Interval:  198 QRS Duration: 103 QT Interval:   391 QTC Calculation: 476 R Axis:   -25 Text Interpretation:  Age not entered, assumed to be  80 years old for  purpose of ECG interpretation Sinus rhythm Ventricular trigeminy  Borderline left axis deviation Borderline prolonged QT interval Confirmed  by Deretha EmoryZACKOWSKI  MD, Reha Martinovich [phone]([id number]) on 12/05/2013 7:17:02 AM      MDM   Final diagnoses:  Fall, initial encounter  Dementia, without behavioral disturbance   Head CT negative for any intercranial injury no evidence of any skull fracture. Patient here is verbal patient seems to be baseline mental status. Patient we discharged back to nursing facility.    Vanetta MuldersScott Chassidy Layson, MD 12/05/13 [phone]

## 2013-12-05 NOTE — Discharge Instructions (Signed)
Workup for the fall included head CT without evidence of any intercranial injury or skull fracture. Patient is talking fine here. Patient seems to be baseline for her mental status. No evidence of any extremity injury. Discharge back to nursing facility.

## 2013-12-05 NOTE — ED Notes (Addendum)
[facility] called for transport back to [facility], family updated on results of CT.

## 2013-12-20 LAB — CBC AND DIFFERENTIAL
HCT: 28 % — AB (ref 36–46)
Hemoglobin: 9.4 g/dL — AB (ref 12.0–16.0)
Platelets: 149 10*3/uL — AB (ref 150–399)
WBC: 64.2 10^3/mL

## 2014-01-12 ENCOUNTER — Encounter: Payer: Self-pay | Admitting: Adult Health

## 2014-01-12 ENCOUNTER — Non-Acute Institutional Stay (SKILLED_NURSING_FACILITY): Payer: Medicare Other | Admitting: Adult Health

## 2014-01-12 DIAGNOSIS — G252 Other specified forms of tremor: Secondary | ICD-10-CM

## 2014-01-12 DIAGNOSIS — R251 Tremor, unspecified: Secondary | ICD-10-CM

## 2014-01-12 DIAGNOSIS — I10 Essential (primary) hypertension: Secondary | ICD-10-CM

## 2014-01-12 DIAGNOSIS — F0280 Dementia in other diseases classified elsewhere without behavioral disturbance: Secondary | ICD-10-CM

## 2014-01-12 DIAGNOSIS — G250 Essential tremor: Secondary | ICD-10-CM

## 2014-01-12 DIAGNOSIS — G309 Alzheimer's disease, unspecified: Secondary | ICD-10-CM

## 2014-01-12 DIAGNOSIS — I499 Cardiac arrhythmia, unspecified: Secondary | ICD-10-CM

## 2014-01-12 NOTE — Progress Notes (Signed)
Patient ID: Dominique Martin, female   DOB: 10-03-1926, 80 y.o.   MRN: [id number]  Nursing Home Location:  [facility]   Place of Service: SNF (31)  Chief Complaint  Patient presents with  . Medical Management of Chronic Issues    HPI:  This is a 80 y.o. female residing at [facility] in the memory care section.  I am here to review their chronic medical issues. The staff reports that her days and nights are mixed up. She tends to be up at night and sleep late into the day. Previous notes indicated that her Remeron was reduced to help with this without change. She is groggy today but able to answer q's, although not with appropriate answers. Her HR has been slightly elevated 95-105 over the past month. Her weight has been stable over the past month at 140 lbs. There are signs of overall decline in her functional capability and mental capability.    Review of Systems:  Review of Systems  Constitutional: Positive for fatigue. Negative for chills, diaphoresis, activity change and appetite change.  HENT: Negative for congestion, trouble swallowing and voice change.   Respiratory: Negative.  Negative for cough, shortness of breath and wheezing.   Cardiovascular: Negative for chest pain and leg swelling.  Gastrointestinal: Negative for abdominal pain, constipation and abdominal distention.  Genitourinary:       Intermittently incontinent  Musculoskeletal: Positive for gait problem. Negative for joint swelling and arthralgias.       Uses walker and wheelchair  Skin: Negative.   Neurological: Positive for tremors. Negative for dizziness, seizures, facial asymmetry, speech difficulty and light-headedness.  Psychiatric/Behavioral: Positive for behavioral problems and agitation.       Sleepy during the day, up at night. Resistant to care at times.     Medications: Patient's Medications  New Prescriptions   No medications on file  Previous Medications   ACETAMINOPHEN (TYLENOL) 500 MG TABLET    Take 500 mg by mouth every 6 (six) hours as needed. Take 2 tablets (1000mg )  twice a day   CARBOXYMETHYLCELLULOSE SODIUM 0.25 % SOLN    Apply 1 drop to eye 2 (two) times daily.    DOCUSATE SODIUM (COLACE) 100 MG CAPSULE    Take 100 mg by mouth daily.    LACTOSE FREE NUTRITION (BOOST PLUS) LIQD    Take 237 mLs by mouth daily. One serving daily   LORAZEPAM (ATIVAN) 0.5 MG TABLET    Take one tablet every 8 hours as needed for agitation   MEMANTINE HCL ER (NAMENDA XR) 28 MG CP24    Take 1 capsule by mouth every morning.    METOPROLOL TARTRATE (LOPRESSOR) 25 MG TABLET    Take 25 mg by mouth 2 (two) times daily.   MIRTAZAPINE (REMERON) 15 MG TABLET    Take 15 mg by mouth at bedtime.   OMEPRAZOLE (PRILOSEC) 20 MG CAPSULE    Take 20 mg by mouth daily. Take one daily to reduce stomach acid.   SERTRALINE (ZOLOFT) 50 MG TABLET    Take 50 mg by mouth at bedtime.   Modified Medications   No medications on file  Discontinued Medications   METOPROLOL TARTRATE (LOPRESSOR) 12.5 MG TABS    Take 0.5 tablets (12.5 mg total) by mouth 2 (two) times daily.   MIRTAZAPINE (REMERON) 30 MG TABLET    Take 30 mg by mouth at bedtime.     Physical Exam:  Filed Vitals:   01/12/14 1032  BP:  130/88  Pulse: 104  Temp: 98.1 F (36.7 C)  Resp: 19  Weight: 140 lb (63.504 kg)   Physical Exam  Constitutional: No distress.  HENT:  Head: Normocephalic.  Eyes: Pupils are equal, round, and reactive to light.  Neck: Neck supple. No thyromegaly present.  Cardiovascular: Exam reveals no gallop.   No murmur heard. Irregular rhythm, no edema  Pulmonary/Chest: Effort normal and breath sounds normal.  Abdominal: Soft. Bowel sounds are normal. She exhibits no distension. There is no tenderness.  Musculoskeletal: Normal range of motion. She exhibits no edema or tenderness.  Lymphadenopathy:    She has no cervical adenopathy.  Neurological: No cranial nerve deficit.  Groggy, Not  oriented to self, location, date, situation. Pleasant, intermittently follows commands. Uses walker and wheelchair. Intentional tremor noted.   Skin: Skin is warm and dry. No rash noted. She is not diaphoretic. No erythema.    Labs reviewed/Significant Diagnostic Results:  Basic Metabolic Panel:  Recent Labs  63/87/56  NA 141  K 4.0  BUN 16  CREATININE 0.7   Liver Function Tests:  Recent Labs  05/28/13  AST 15  ALT 17  ALKPHOS 59   No results for input(s): LIPASE, AMYLASE in the last 8760 hours. No results for input(s): AMMONIA in the last 8760 hours. CBC:  Recent Labs  05/28/13  WBC 5.8  HGB 13.0  HCT 39  PLT 237   CBG: No results for input(s): GLUCAP in the last 8760 hours. TSH:  Recent Labs  05/28/13  TSH 1.11   A1C: Lab Results  Component Value Date   HGBA1C 6.1* 04/21/2011    Assessment/Plan Irregular heart rhythm Her HR is irregular and over 100 today. Will increase the metoprolol to 25mg  BID. Check a TSH, CBC, CMP. She has a hx of falls and so apparently she is not anticoagulated.  Alzheimer's disease She continues to be awake at night and sleepy during the day. Dose reduction in Remeron has not helped. The staff reports that her family would like to reduce her pill burden. Will try another reduction in Remeron, as are not sure if this is helping at this point. Will need to monitor her behavior and weight.   Hypertension BP has remained stable on BB> Continue and monitor. Check BMP.  TREMOR, ESSENTIAL This does not interfere with her function, therefore she is not on meds. Continue to monitor for signs of worsening, would consider propranolol and d/c the metoprolol.    Labs/tests ordered TSH, BMP, CBC  Peggye Ley, ANP [facility] [phone]

## 2014-01-12 NOTE — Assessment & Plan Note (Addendum)
Her HR is irregular and over 100 today. Will increase the metoprolol to 25mg  BID. Check a TSH, CBC, CMP. She has a hx of falls and so apparently she is not anticoagulated.

## 2014-01-12 NOTE — Assessment & Plan Note (Signed)
She continues to be awake at night and sleepy during the day. Dose reduction in Remeron has not helped. The staff reports that her family would like to reduce her pill burden. Will try another reduction in Remeron, as are not sure if this is helping at this point. Will need to monitor her behavior and weight.

## 2014-01-13 NOTE — Assessment & Plan Note (Signed)
BP has remained stable on BB> Continue and monitor. Check BMP.

## 2014-01-13 NOTE — Assessment & Plan Note (Signed)
This does not interfere with her function, therefore she is not on meds. Continue to monitor for signs of worsening, would consider propranolol and d/c the metoprolol.

## 2014-01-14 LAB — HEPATIC FUNCTION PANEL
ALT: 12 U/L (ref 7–35)
AST: 14 U/L (ref 13–35)
Alkaline Phosphatase: 78 U/L (ref 25–125)
Bilirubin, Total: 0.4 mg/dL

## 2014-01-14 LAB — CBC AND DIFFERENTIAL
HCT: 37 % (ref 36–46)
HEMOGLOBIN: 12.7 g/dL (ref 12.0–16.0)
Platelets: 238 10*3/uL (ref 150–399)
WBC: 6.4 10^3/mL

## 2014-01-14 LAB — BASIC METABOLIC PANEL
BUN: 17 mg/dL (ref 4–21)
Creatinine: 0.7 mg/dL (ref 0.5–1.1)
Glucose: 87 mg/dL
POTASSIUM: 3.9 mmol/L (ref 3.4–5.3)
SODIUM: 140 mmol/L (ref 137–147)

## 2014-01-14 LAB — TSH: TSH: 1.83 u[IU]/mL (ref 0.41–5.90)

## 2014-02-09 ENCOUNTER — Non-Acute Institutional Stay (SKILLED_NURSING_FACILITY): Payer: Medicare Other | Admitting: Adult Health

## 2014-02-09 ENCOUNTER — Encounter: Payer: Self-pay | Admitting: Adult Health

## 2014-02-09 DIAGNOSIS — F0280 Dementia in other diseases classified elsewhere without behavioral disturbance: Secondary | ICD-10-CM

## 2014-02-09 DIAGNOSIS — I10 Essential (primary) hypertension: Secondary | ICD-10-CM

## 2014-02-09 DIAGNOSIS — G309 Alzheimer's disease, unspecified: Secondary | ICD-10-CM

## 2014-02-09 DIAGNOSIS — I499 Cardiac arrhythmia, unspecified: Secondary | ICD-10-CM

## 2014-02-09 NOTE — Assessment & Plan Note (Signed)
BP stable on lopressor. BMP WNL. Continue to monitor.

## 2014-02-09 NOTE — Assessment & Plan Note (Signed)
Continue functional decline with weight loss. She is currently on Namenda. Off Remeron with no improvement in wakefulness. All of her labs are WNL, this mostly likely represents the course of AD, rather than another medical cause. She does have an order for Ativan for agitation but this has only been given 4x this month.

## 2014-02-09 NOTE — Progress Notes (Signed)
Patient ID: Dominique Martin, female   DOB: 05-16-1926, 80 y.o.   MRN: [id number]  Nursing Home Location:  [facility]   Place of Service: SNF (31)  Chief Complaint  Patient presents with  . Medical Management of Chronic Issues    HPI:  This is a 80 y.o. female residing at [facility] in the memory care section.  I am here to review their chronic medical issues. She has a hx of AD, HLD, essential tremor, OP, GERD, and HTN. The staff reports that she continues to be groggy. She was previously on Remeron and this was discontinued last month with out improvement in her wakefulness.  Her weight has decreased to 134lbs, this is a 6lb drop from our last visit. There are signs of overall decline in her functional capability and mental capability. Her last MMSE was 14/30, failed clock on 01/25/14.    Review of Systems:  Review of Systems  Constitutional: Positive for fatigue. Negative for chills, diaphoresis, activity change and appetite change.  HENT: Negative for congestion, trouble swallowing and voice change.   Respiratory: Negative.  Negative for cough, shortness of breath and wheezing.   Cardiovascular: Negative for chest pain and leg swelling.  Gastrointestinal: Negative for abdominal pain, constipation and abdominal distention.  Genitourinary:       Intermittently incontinent  Musculoskeletal: Positive for gait problem. Negative for joint swelling and arthralgias.       Uses walker and wheelchair  Skin: Negative.   Neurological: Positive for tremors. Negative for dizziness, seizures, facial asymmetry, speech difficulty and light-headedness.  Psychiatric/Behavioral: Positive for behavioral problems and agitation.       Sleepy during the day, up at night. Resistant to care at times.     Medications: Patient's Medications  New Prescriptions   No medications on file  Previous Medications   ACETAMINOPHEN (TYLENOL) 500 MG TABLET    Take 500  mg by mouth every 6 (six) hours as needed. Take 2 tablets (1000mg )  twice a day   CARBOXYMETHYLCELLULOSE SODIUM 0.25 % SOLN    Apply 1 drop to eye 2 (two) times daily.    DOCUSATE SODIUM (COLACE) 100 MG CAPSULE    Take 100 mg by mouth daily.    LACTOSE FREE NUTRITION (BOOST PLUS) LIQD    Take 237 mLs by mouth daily. One serving daily   LORAZEPAM (ATIVAN) 0.5 MG TABLET    Take one tablet every 8 hours as needed for agitation   MEMANTINE HCL ER (NAMENDA XR) 28 MG CP24    Take 1 capsule by mouth every morning.    METOPROLOL TARTRATE (LOPRESSOR) 25 MG TABLET    Take 25 mg by mouth 2 (two) times daily.   OMEPRAZOLE (PRILOSEC) 20 MG CAPSULE    Take 20 mg by mouth daily. Take one daily to reduce stomach acid.   SERTRALINE (ZOLOFT) 50 MG TABLET    Take 50 mg by mouth at bedtime.   Modified Medications   No medications on file  Discontinued Medications   MIRTAZAPINE (REMERON) 15 MG TABLET    Take 15 mg by mouth at bedtime.     Physical Exam:  Filed Vitals:   02/09/14 1139  BP: 117/64  Pulse: 51  Temp: 96.8 F (36 C)  Resp: 18  Weight: 134 lb (60.782 kg)  SpO2: 92%   Physical Exam  Constitutional: No distress.  HENT:  Head: Normocephalic.  Eyes: Pupils are equal, round, and reactive to light.  Neck: Neck  supple. No thyromegaly present.  Cardiovascular: Exam reveals no gallop.   No murmur heard. Irregular rhythm, no edema  Pulmonary/Chest: Effort normal and breath sounds normal.  Abdominal: Soft. Bowel sounds are normal. She exhibits no distension. There is no tenderness.  Musculoskeletal: Normal range of motion. She exhibits no edema or tenderness.  Lymphadenopathy:    She has no cervical adenopathy.  Neurological: No cranial nerve deficit.  Groggy, Not oriented to self, location, date, situation. Pleasant, intermittently follows commands. Uses walker and wheelchair. Intentional tremor noted.   Skin: Skin is warm and dry. No rash noted. She is not diaphoretic. No erythema.     Labs reviewed/Significant Diagnostic Results:  Basic Metabolic Panel:  Recent Labs  21/30/86 01/14/14  NA 141 140  K 4.0 3.9  BUN 16 17  CREATININE 0.7 0.7   Liver Function Tests:  Recent Labs  05/28/13 01/14/14  AST 15 14  ALT 17 12  ALKPHOS 59 78   No results for input(s): LIPASE, AMYLASE in the last 8760 hours. No results for input(s): AMMONIA in the last 8760 hours. CBC:  Recent Labs  05/28/13 12/20/13 01/14/14  WBC 5.8  5.8 64.2 6.4  HGB 13.0  13.0 9.4* 12.7  HCT 39  39 28* 37  PLT 237  237 149* 238   CBG: No results for input(s): GLUCAP in the last 8760 hours. TSH:  Recent Labs  05/28/13 01/14/14  TSH 1.11 1.83   A1C: Lab Results  Component Value Date   HGBA1C 6.1* 04/21/2011    Assessment/Plan  Alzheimer's disease Continue functional decline with weight loss. She is currently on Namenda. Off Remeron with no improvement in wakefulness. All of her labs are WNL, this mostly likely represents the course of AD, rather than another medical cause. She does have an order for Ativan for agitation but this has only been given 4x this month.   Hypertension BP stable on lopressor. BMP WNL. Continue to monitor.  Irregular heart rhythm Rate controlled with increased BB last month. Check EKG, add ASA 81 mg daily. No coumadin due to frequent falls.     Peggye Ley, ANP [facility] [phone]

## 2014-02-09 NOTE — Assessment & Plan Note (Signed)
Rate controlled with increased BB last month. Check EKG, add ASA 81 mg daily. No coumadin due to frequent falls.

## 2014-03-03 ENCOUNTER — Encounter: Payer: Self-pay | Admitting: Internal Medicine

## 2014-03-03 ENCOUNTER — Non-Acute Institutional Stay (SKILLED_NURSING_FACILITY): Payer: Medicare Other | Admitting: Internal Medicine

## 2014-03-03 DIAGNOSIS — S81812A Laceration without foreign body, left lower leg, initial encounter: Secondary | ICD-10-CM

## 2014-03-03 DIAGNOSIS — F0280 Dementia in other diseases classified elsewhere without behavioral disturbance: Secondary | ICD-10-CM

## 2014-03-03 DIAGNOSIS — S5011XA Contusion of right forearm, initial encounter: Secondary | ICD-10-CM

## 2014-03-03 DIAGNOSIS — G309 Alzheimer's disease, unspecified: Secondary | ICD-10-CM

## 2014-03-03 DIAGNOSIS — S81802A Unspecified open wound, left lower leg, initial encounter: Secondary | ICD-10-CM

## 2014-03-03 NOTE — Progress Notes (Signed)
Patient ID: Dominique Martin, female   DOB: 04-27-1926, 81 y.o.   MRN: [id number]  Location:  [facility] [address] Provider:  Alden Feagan L. Renato Gails, D.O., C.M.D.  Code Status:  DNR  Chief Complaint  Patient presents with  . Acute Visit    left inner leg skin tear with redness, warmth; right forearm hematoma    HPI:  81 yo with late stage Alzheimer's disease, depression, falls, generalized weakness seen due to new onset of redness, warmth of her left inner lower leg at the site of a skin tear.  She also has a hematoma of her right forearm with significant surrounding ecchymoses.  Upon speaking with her nurse, she has been declining over the past couple of months--when she walks around the unit, she tends to bump into the columns in the central seating area.  She does not speak much anymore.  She has remained "groggy" as noted 12/15.  She has lost 4 more lbs since then.  She does not even open her eyes during my assessment today and does not follow commands.    Review of Systems:  Review of Systems  Unable to perform ROS: dementia    Medications: Patient's Medications  New Prescriptions   No medications on file  Previous Medications   ACETAMINOPHEN (TYLENOL) 500 MG TABLET    Take 500 mg by mouth every 6 (six) hours as needed. Take 2 tablets (1000mg )  twice a day   CARBOXYMETHYLCELLULOSE SODIUM 0.25 % SOLN    Apply 1 drop to eye 2 (two) times daily.    DOCUSATE SODIUM (COLACE) 100 MG CAPSULE    Take 100 mg by mouth daily.    LACTOSE FREE NUTRITION (BOOST PLUS) LIQD    Take 237 mLs by mouth daily. One serving daily   LORAZEPAM (ATIVAN) 0.5 MG TABLET    Take one tablet every 8 hours as needed for agitation   MEMANTINE HCL ER (NAMENDA XR) 28 MG CP24    Take 1 capsule by mouth every morning.    METOPROLOL TARTRATE (LOPRESSOR) 25 MG TABLET    Take 25 mg by mouth 2 (two) times daily.   OMEPRAZOLE (PRILOSEC) 20 MG CAPSULE    Take 20 mg by mouth daily. Take one daily to reduce stomach acid.   SERTRALINE (ZOLOFT) 50 MG TABLET    Take 50 mg by mouth at bedtime.   Modified Medications   No medications on file  Discontinued Medications   No medications on file    Physical Exam: Filed Vitals:   03/03/14 1204  BP: 120/71  Pulse: 75  Temp: 97.9 F (36.6 C)  Resp: 16  Height: 5\' 3"  (1.6 m)  Weight: 130 lb (58.968 kg)  SpO2: 99%  Physical Exam  Constitutional: No distress.  Resting peacefully in bed, but does not open her eyes to look at me or communicate at all  Cardiovascular: Intact distal pulses.   irreg  Pulmonary/Chest: Effort normal and breath sounds normal.  Abdominal: Soft. Bowel sounds are normal. She exhibits no distension and no mass. There is no tenderness.  Skin:  Ecchymoses over right forearm with hematoma present;  Also some ecchymoses on upper right arm;  Left medial distal leg with small skin tear, no longer has any surrounding erythema, warmth, no drainage present    Labs reviewed: Basic Metabolic Panel:  Recent Labs  40/98/11 01/14/14  NA 141 140  K 4.0 3.9  BUN 16 17  CREATININE 0.7 0.7    Liver Function Tests:  Recent Labs  05/28/13 01/14/14  AST 15 14  ALT 17 12  ALKPHOS 59 78    CBC:  Recent Labs  05/28/13 12/20/13 01/14/14  WBC 5.8  5.8 64.2 6.4  HGB 13.0  13.0 9.4* 12.7  HCT 39  39 28* 37  PLT 237  237 149* 238    Assessment/Plan 1. Skin tear of left lower leg without complication, initial encounter -there is no evidence of cellulitis or abscess at present -cont to keep area clean and dry to prevent infection  2. Traumatic hematoma of right forearm, initial encounter -suspected to be due to walking into the columns in the central seating area of the unit which she has been known to do -cont to monitor for improvement  3. Alzheimer's disease -late stage, remains ambulatory, but mostly nonverbal, is losing weight, and remains sleepy most of the day -remeron worsened the lethargy component -she may qualify for  hospice care in the very near future--can be reassessed at her routine visit (palliative care performance scale)  Family/ staff Communication: discussed with her nurse  Goals of care:  long term care in memory care, DNR code status

## 2014-03-18 ENCOUNTER — Encounter: Payer: Self-pay | Admitting: Adult Health

## 2014-03-18 ENCOUNTER — Non-Acute Institutional Stay (SKILLED_NURSING_FACILITY): Payer: Medicare Other | Admitting: Adult Health

## 2014-03-18 DIAGNOSIS — I499 Cardiac arrhythmia, unspecified: Secondary | ICD-10-CM

## 2014-03-18 DIAGNOSIS — F329 Major depressive disorder, single episode, unspecified: Secondary | ICD-10-CM

## 2014-03-18 DIAGNOSIS — F0280 Dementia in other diseases classified elsewhere without behavioral disturbance: Secondary | ICD-10-CM

## 2014-03-18 DIAGNOSIS — G309 Alzheimer's disease, unspecified: Secondary | ICD-10-CM

## 2014-03-18 DIAGNOSIS — K219 Gastro-esophageal reflux disease without esophagitis: Secondary | ICD-10-CM

## 2014-03-18 DIAGNOSIS — F32A Depression, unspecified: Secondary | ICD-10-CM

## 2014-03-18 DIAGNOSIS — I10 Essential (primary) hypertension: Secondary | ICD-10-CM

## 2014-03-23 NOTE — Assessment & Plan Note (Signed)
No reports of heartburn, continue prilosec

## 2014-03-23 NOTE — Assessment & Plan Note (Signed)
Had increase in agitation when Remeron was discontinue last month, so it was resumed. She continues to have periods of sleepiness and agitation.  Continue to monitor for now. The sleepiness did not seem to be related to the Remeron.

## 2014-03-23 NOTE — Assessment & Plan Note (Signed)
Stable, continue current regimen and monitor

## 2014-03-23 NOTE — Assessment & Plan Note (Signed)
Continued functional decline over time. Currently on Namenda, and ativan prn. Continue current meds and provide supportive care.

## 2014-03-23 NOTE — Progress Notes (Signed)
Patient ID: Dominique Martin, female   DOB: [birth year], 81 y.o.   MRN: [id number]     Nursing Home Location:  [facility]   Place of Service: SNF (31)  Chief Complaint  Patient presents with  . Medical Management of Chronic Issues    HPI:  This is a 81 y.o. female residing at [facility] in the memory care section.  I am here to review their chronic medical issues. She has a hx of AD, HLD, essential tremor, OP, GERD, and HTN. The staff reports that she continues to be groggy.  Her weight has decreased to 130 lbs.  There are signs of overall decline in her functional capability and mental capability. Her last MMSE was 14/30, failed clock on 01/25/14.    Review of Systems:  Review of Systems  Constitutional: Positive for fatigue. Negative for chills, diaphoresis, activity change and appetite change.  HENT: Negative for congestion, trouble swallowing and voice change.   Respiratory: Negative.  Negative for cough, shortness of breath and wheezing.   Cardiovascular: Negative for chest pain and leg swelling.  Gastrointestinal: Negative for abdominal pain, constipation and abdominal distention.  Genitourinary:       Intermittently incontinent  Musculoskeletal: Positive for gait problem. Negative for joint swelling and arthralgias.       Uses walker and wheelchair  Skin: Negative.   Neurological: Positive for tremors. Negative for dizziness, seizures, facial asymmetry, speech difficulty and light-headedness.  Psychiatric/Behavioral: Positive for behavioral problems and agitation.       Sleepy during the day, up at night. Resistant to care at times.     Medications: Patient's Medications  New Prescriptions   No medications on file  Previous Medications   ACETAMINOPHEN (TYLENOL) 500 MG TABLET    Take 500 mg by mouth every 6 (six) hours as needed. Take 2 tablets (1000mg )  twice a day   CARBOXYMETHYLCELLULOSE SODIUM 0.25 % SOLN    Apply 1 drop to eye  2 (two) times daily.    DOCUSATE SODIUM (COLACE) 100 MG CAPSULE    Take 100 mg by mouth daily.    LACTOSE FREE NUTRITION (BOOST PLUS) LIQD    Take 237 mLs by mouth daily. One serving daily   LORAZEPAM (ATIVAN) 0.5 MG TABLET    Take one tablet every 8 hours as needed for agitation   MEMANTINE HCL ER (NAMENDA XR) 28 MG CP24    Take 1 capsule by mouth every morning.    METOPROLOL TARTRATE (LOPRESSOR) 25 MG TABLET    Take 25 mg by mouth 2 (two) times daily.   OMEPRAZOLE (PRILOSEC) 20 MG CAPSULE    Take 20 mg by mouth daily. Take one daily to reduce stomach acid.   SERTRALINE (ZOLOFT) 50 MG TABLET    Take 50 mg by mouth at bedtime.   Modified Medications   No medications on file  Discontinued Medications   No medications on file     Physical Exam:  Filed Vitals:   03/18/14 1535  BP: 103/72  Pulse: 58  Temp: 98 F (36.7 C)  Resp: 18  Weight: 130 lb (58.968 kg)   Physical Exam  Constitutional: No distress.  HENT:  Head: Normocephalic.  Eyes: Pupils are equal, round, and reactive to light.  Neck: Neck supple. No thyromegaly present.  Cardiovascular: Exam reveals no gallop.   No murmur heard. Regular with PVCS  Pulmonary/Chest: Effort normal and breath sounds normal.  Abdominal: Soft. Bowel sounds are normal. She exhibits no distension.  There is no tenderness.  Musculoskeletal: Normal range of motion. She exhibits no edema or tenderness.  Lymphadenopathy:    She has no cervical adenopathy.  Neurological: No cranial nerve deficit.  Groggy, Not oriented to self, location, date, situation. Pleasant, intermittently follows commands. Uses walker and wheelchair. Intentional tremor noted.   Skin: Skin is warm and dry. No rash noted. She is not diaphoretic. No erythema.    Labs reviewed/Significant Diagnostic Results:  Basic Metabolic Panel:  Recent Labs  13/08/65 01/14/14  NA 141 140  K 4.0 3.9  BUN 16 17  CREATININE 0.7 0.7   Liver Function Tests:  Recent Labs  05/28/13  01/14/14  AST 15 14  ALT 17 12  ALKPHOS 59 78   No results for input(s): LIPASE, AMYLASE in the last 8760 hours. No results for input(s): AMMONIA in the last 8760 hours. CBC:  Recent Labs  05/28/13 12/20/13 01/14/14  WBC 5.8  5.8 64.2 6.4  HGB 13.0  13.0 9.4* 12.7  HCT 39  39 28* 37  PLT 237  237 149* 238   CBG: No results for input(s): GLUCAP in the last 8760 hours. TSH:  Recent Labs  05/28/13 01/14/14  TSH 1.11 1.83   A1C: Lab Results  Component Value Date   HGBA1C 6.1* 04/21/2011    Assessment/Plan  Hypertension Stable, continue current regimen and monitor   GERD (gastroesophageal reflux disease) No reports of heartburn, continue prilosec   Depression Had increase in agitation when Remeron was discontinue last month, so it was resumed. She continues to have periods of sleepiness and agitation.  Continue to monitor for now. The sleepiness did not seem to be related to the Remeron.   Alzheimer's disease Continued functional decline over time. Currently on Namenda, and ativan prn. Continue current meds and provide supportive care.      Peggye Ley, ANP [facility] [phone]

## 2014-04-16 ENCOUNTER — Non-Acute Institutional Stay (SKILLED_NURSING_FACILITY): Payer: Medicare Other | Admitting: Adult Health

## 2014-04-16 ENCOUNTER — Encounter: Payer: Self-pay | Admitting: Adult Health

## 2014-04-16 DIAGNOSIS — G250 Essential tremor: Secondary | ICD-10-CM

## 2014-04-16 DIAGNOSIS — R000 Tachycardia, unspecified: Secondary | ICD-10-CM

## 2014-04-16 DIAGNOSIS — K219 Gastro-esophageal reflux disease without esophagitis: Secondary | ICD-10-CM | POA: Diagnosis not present

## 2014-04-16 DIAGNOSIS — F329 Major depressive disorder, single episode, unspecified: Secondary | ICD-10-CM

## 2014-04-16 DIAGNOSIS — F32A Depression, unspecified: Secondary | ICD-10-CM

## 2014-04-16 DIAGNOSIS — M810 Age-related osteoporosis without current pathological fracture: Secondary | ICD-10-CM

## 2014-04-16 DIAGNOSIS — G252 Other specified forms of tremor: Secondary | ICD-10-CM

## 2014-04-16 DIAGNOSIS — I10 Essential (primary) hypertension: Secondary | ICD-10-CM

## 2014-04-16 DIAGNOSIS — G309 Alzheimer's disease, unspecified: Secondary | ICD-10-CM

## 2014-04-16 DIAGNOSIS — F0280 Dementia in other diseases classified elsewhere without behavioral disturbance: Secondary | ICD-10-CM

## 2014-04-16 DIAGNOSIS — F411 Generalized anxiety disorder: Secondary | ICD-10-CM | POA: Diagnosis not present

## 2014-04-16 DIAGNOSIS — R251 Tremor, unspecified: Secondary | ICD-10-CM | POA: Diagnosis not present

## 2014-04-16 NOTE — Progress Notes (Signed)
Patient ID: Dominique LeveeDorothy S Korenek, female   DOB: [birth year], 81 y.o.   MRN: [id number]     Nursing Home Location:  [facility]   Place of Service: SNF (31)  Chief Complaint  Patient presents with  . Medical Management of Chronic Issues    HPI:  This is a 81 [facility]. [facility].  I am here to review their chronic medical issues. She has a hx of AD, HLD, essential tremor, OP, GERD, and HTN.  Her weight has decreased to 131.2 lbs, a 9 lb loss since Nov of 2015. She has had an overall functional decline in the past year. She currently is incontinent and WC bound. [facility] staff reports [facility] and makes attempts to remove it and get up on her own. Her gait has been unsteady for quite some time She was placed on miralax since her last visit for constipation and this has improved.    Review of Systems:  Review of Systems  Unable to perform ROS: Dementia  Genitourinary:       Intermittently incontinent  Musculoskeletal:       Uses walker and wheelchair  Psychiatric/Behavioral:       Sleepy during the day, up at night. Resistant to care at times.     Medications: Patient's Medications  New Prescriptions   No medications on file  Previous Medications   ACETAMINOPHEN (TYLENOL) 500 MG TABLET    Take 500 mg by mouth every 6 (six) hours as needed. Take 2 tablets (1000mg )  twice a day   ASPIRIN 81 MG TABLET    Take 81 mg by mouth daily.   CARBOXYMETHYLCELLULOSE SODIUM 0.25 % SOLN    Apply 1 drop to eye 2 (two) times daily.    DOCUSATE SODIUM (COLACE) 100 MG CAPSULE    Take 100 mg by mouth daily.    LACTOSE FREE NUTRITION (BOOST PLUS) LIQD    Take 237 mLs by mouth daily. One serving daily   LORAZEPAM (ATIVAN) 0.5 MG TABLET    Take one tablet every 8 hours as needed for agitation   MEMANTINE HCL ER (NAMENDA XR) 28 MG CP24    Take 1 capsule by mouth every morning.    METOPROLOL TARTRATE  (LOPRESSOR) 25 MG TABLET    Take 25 mg by mouth 2 (two) times daily.   MIRTAZAPINE (REMERON) 7.5 MG TABLET    Take 7.5 mg by mouth at bedtime.   OMEPRAZOLE (PRILOSEC) 20 MG CAPSULE    Take 20 mg by mouth daily. Take one daily to reduce stomach acid.   POLYETHYLENE GLYCOL (MIRALAX / GLYCOLAX) PACKET    Take 17 g by mouth daily.   SERTRALINE (ZOLOFT) 50 MG TABLET    Take 50 mg by mouth at bedtime.   Modified Medications   No medications on file  Discontinued Medications   No medications on file     Physical Exam:  Filed Vitals:   04/16/14 1038  BP: 120/79  Pulse: 89  Temp: 96 F (35.6 C)  Resp: 18  Weight: 131 lb 3.2 oz (59.512 kg)  SpO2: 97%   Physical Exam  Constitutional: No distress.  HENT:  Head: Normocephalic.  Eyes: Pupils are equal, round, and reactive to light.  Neck: Neck supple. No thyromegaly present.  Cardiovascular: Exam reveals no gallop.   No murmur heard. Regular with PVCS  Pulmonary/Chest: Effort normal and breath sounds normal.  Abdominal: Soft. Bowel sounds are normal. She exhibits no distension. There is no tenderness.  Musculoskeletal: Normal range of motion. She exhibits no edema or tenderness.  Some decreased ROM and rigidity noted to both elbows and shoulders  Lymphadenopathy:    She has no cervical adenopathy.  Neurological: No cranial nerve deficit.  Groggy, Not oriented to self, location, date, situation. Pleasant, intermittently follows commands. Intentional tremor noted  Skin: Skin is warm and dry. No rash noted. She is not diaphoretic. No erythema.  Bruising to right and left lower arms    Labs reviewed/Significant Diagnostic Results:  Basic Metabolic Panel:  Recent Labs  69/62/95 01/14/14  NA 141 140  K 4.0 3.9  BUN 16 17  CREATININE 0.7 0.7   Liver Function Tests:  Recent Labs  05/28/13 01/14/14  AST 15 14  ALT 17 12  ALKPHOS 59 78   No results for input(s): LIPASE, AMYLASE in the last 8760 hours. No results for input(s):  AMMONIA in the last 8760 hours. CBC:  Recent Labs  05/28/13 12/20/13 01/14/14  WBC 5.8  5.8 64.2 6.4  HGB 13.0  13.0 9.4* 12.7  HCT 39  39 28* 37  PLT 237  237 149* 238   CBG: No results for input(s): GLUCAP in the last 8760 hours. TSH:  Recent Labs  05/28/13 01/14/14  TSH 1.11 1.83   A1C: Lab Results  Component Value Date   HGBA1C 6.1* 04/21/2011    Assessment/Plan  1. Alzheimer's disease Progressive decline on Namenda. Requires more assistance and she is less mobile. Continue meds but consider discontinue if further functional decline or decline in communication. She is currently able to f/c and answer q's intermittently.  2. Essential hypertension Controlled. BMP monitored periodically and WNL  3. Gastroesophageal reflux disease without esophagitis Controlled on Prilosec without complaint.  4. Osteoporosis Completed 5 year course of bisphosphonate.    5. TREMOR, ESSENTIAL Stable no changed. Evaluated by neuro in the past.   6. Depression Attempts to d/c Remeron at bedtime have led to increased agitation. Also on Zoloft. Has periods of agitation as well as excess sleepiness. She is also losing weight, most likely due to overall decline. Her weight has not responded to Remeron in the past. Continue current regimen at this time.   7. Generalized anxiety disorder See above  8. Tachycardia Controlled with BB and ASA   Peggye Ley, ANP [facility] [phone]

## 2014-05-18 ENCOUNTER — Non-Acute Institutional Stay (SKILLED_NURSING_FACILITY): Payer: Medicare Other | Admitting: Adult Health

## 2014-05-18 ENCOUNTER — Encounter: Payer: Self-pay | Admitting: Adult Health

## 2014-05-18 DIAGNOSIS — R000 Tachycardia, unspecified: Secondary | ICD-10-CM | POA: Diagnosis not present

## 2014-05-18 DIAGNOSIS — F329 Major depressive disorder, single episode, unspecified: Secondary | ICD-10-CM

## 2014-05-18 DIAGNOSIS — F0280 Dementia in other diseases classified elsewhere without behavioral disturbance: Secondary | ICD-10-CM

## 2014-05-18 DIAGNOSIS — K219 Gastro-esophageal reflux disease without esophagitis: Secondary | ICD-10-CM | POA: Diagnosis not present

## 2014-05-18 DIAGNOSIS — I10 Essential (primary) hypertension: Secondary | ICD-10-CM

## 2014-05-18 DIAGNOSIS — F411 Generalized anxiety disorder: Secondary | ICD-10-CM | POA: Diagnosis not present

## 2014-05-18 DIAGNOSIS — F32A Depression, unspecified: Secondary | ICD-10-CM

## 2014-05-18 DIAGNOSIS — G309 Alzheimer's disease, unspecified: Secondary | ICD-10-CM

## 2014-05-18 NOTE — Progress Notes (Signed)
Patient ID: Dominique Martin, female   DOB: 15-Mar-1926, 81 y.o.   MRN: [id number]     Nursing Home Location:  [facility]   Place of Service: SNF (31)  Chief Complaint  Patient presents with  . Medical Management of Chronic Issues    HPI:  This is a 81 y.o. female [facility].  I am here to review their chronic medical issues. She has a hx of AD, HLD, essential tremor, OP, GERD, and HTN.  Her weight has decreased to 130 lbs, a 10 lb loss since Nov of 2015. She has had an overall functional decline in the past year. She currently is incontinent and WC bound. The resident wears a safety belt in the WC. She has some agitation with care but has not used any Ativan in the past 2 weeks [facility]. There are no complaints regarding her care today. Her last MMSE 12/2013 was 14/30, failed clock.  Review of Systems:  Review of Systems  Unable to perform ROS: Dementia  Genitourinary:       Intermittently incontinent  Musculoskeletal:       Uses WC  Psychiatric/Behavioral:       Sleepy during the day, up at night. Resistant to care at times.     Medications: Patient's Medications  New Prescriptions   No medications on file  Previous Medications   ACETAMINOPHEN (TYLENOL) 500 MG TABLET    Take 500 mg by mouth every 6 (six) hours as needed. Take 2 tablets (1000mg )  twice a day   ASPIRIN 81 MG TABLET    Take 81 mg by mouth daily.   CARBOXYMETHYLCELLULOSE SODIUM 0.25 % SOLN    Apply 1 drop to eye 2 (two) times daily.    DOCUSATE SODIUM (COLACE) 100 MG CAPSULE    Take 100 mg by mouth daily.    LACTOSE FREE NUTRITION (BOOST PLUS) LIQD    Take 237 mLs by mouth daily. One serving daily   LORAZEPAM (ATIVAN) 0.5 MG TABLET    Take one tablet every 8 hours as needed for agitation   MEMANTINE HCL ER (NAMENDA XR) 28 MG CP24    Take 1 capsule by mouth every morning.    METOPROLOL TARTRATE (LOPRESSOR) 25 MG TABLET    Take 25 mg  by mouth 2 (two) times daily.   MIRTAZAPINE (REMERON) 7.5 MG TABLET    Take 7.5 mg by mouth at bedtime.   OMEPRAZOLE (PRILOSEC) 20 MG CAPSULE    Take 20 mg by mouth daily. Take one daily to reduce stomach acid.   POLYETHYLENE GLYCOL (MIRALAX / GLYCOLAX) PACKET    Take 17 g by mouth daily.   SERTRALINE (ZOLOFT) 50 MG TABLET    Take 50 mg by mouth at bedtime.   Modified Medications   No medications on file  Discontinued Medications   No medications on file     Physical Exam:  Filed Vitals:   05/18/14 1032  BP: 130/75  Pulse: 73  Temp: 96.3 F (35.7 C)  Resp: 16  Weight: 130 lb (58.968 kg)  SpO2: 96%   Physical Exam  Constitutional: No distress.  HENT:  Head: Normocephalic.  Eyes: Pupils are equal, round, and reactive to light.  Neck: Neck supple. No thyromegaly present.  Cardiovascular: Exam reveals no gallop.   No murmur heard. Regular with PVCS  Pulmonary/Chest: Effort normal and breath sounds normal.  Abdominal: Soft. Bowel sounds are normal. She exhibits no  distension. There is no tenderness.  Musculoskeletal: Normal range of motion. She exhibits no edema or tenderness.  Lymphadenopathy:    She has no cervical adenopathy.  Neurological: No cranial nerve deficit.  Not oriented to self, location, date, situation. Pleasant, intermittently follows commands. Intentional tremor noted  Skin: Skin is warm and dry. No rash noted. She is not diaphoretic. No erythema.    Labs reviewed/Significant Diagnostic Results:  Basic Metabolic Panel:  Recent Labs  16/10/96 01/14/14  NA 141 140  K 4.0 3.9  BUN 16 17  CREATININE 0.7 0.7   Liver Function Tests:  Recent Labs  05/28/13 01/14/14  AST 15 14  ALT 17 12  ALKPHOS 59 78   No results for input(s): LIPASE, AMYLASE in the last 8760 hours. No results for input(s): AMMONIA in the last 8760 hours. CBC:  Recent Labs  05/28/13 12/20/13 01/14/14  WBC 5.8  5.8 64.2 6.4  HGB 13.0  13.0 9.4* 12.7  HCT 39  39 28* 37    PLT 237  237 149* 238   CBG: No results for input(s): GLUCAP in the last 8760 hours. TSH:  Recent Labs  05/28/13 01/14/14  TSH 1.11 1.83   A1C: Lab Results  Component Value Date   HGBA1C 6.1* 04/21/2011    Assessment/Plan  1. Alzheimer's disease Progressive decline on Namenda. Requires more assistance and she is less mobile. Continue meds but consider discontinue if further functional decline or decline in communication. She is currently able to f/c and answer q's intermittently.  2. Essential hypertension Controlled. BMP monitored periodically and WNL  3. Gastroesophageal reflux disease without esophagitis Controlled on Prilosec without complaint.  4. Depression Attempts to d/c Remeron at bedtime have led to increased agitation. Also on Zoloft. Has periods of agitation as well as excess sleepiness. She is also losing weight, most likely due to overall decline. Continue current regimen at this time.   5. Generalized anxiety disorder See above. Continue ativan prn.  6. Tachycardia Controlled with BB and ASA   Peggye Ley, ANP [facility] [phone]

## 2014-06-17 ENCOUNTER — Encounter: Payer: Self-pay | Admitting: Internal Medicine

## 2014-06-24 ENCOUNTER — Encounter: Payer: Self-pay | Admitting: Adult Health

## 2014-06-24 ENCOUNTER — Non-Acute Institutional Stay (SKILLED_NURSING_FACILITY): Payer: Medicare Other | Admitting: Adult Health

## 2014-06-24 DIAGNOSIS — F329 Major depressive disorder, single episode, unspecified: Secondary | ICD-10-CM

## 2014-06-24 DIAGNOSIS — F0280 Dementia in other diseases classified elsewhere without behavioral disturbance: Secondary | ICD-10-CM

## 2014-06-24 DIAGNOSIS — G309 Alzheimer's disease, unspecified: Secondary | ICD-10-CM | POA: Diagnosis not present

## 2014-06-24 DIAGNOSIS — F32A Depression, unspecified: Secondary | ICD-10-CM

## 2014-06-24 DIAGNOSIS — I10 Essential (primary) hypertension: Secondary | ICD-10-CM | POA: Diagnosis not present

## 2014-06-24 DIAGNOSIS — K5901 Slow transit constipation: Secondary | ICD-10-CM | POA: Diagnosis not present

## 2014-06-24 DIAGNOSIS — K5900 Constipation, unspecified: Secondary | ICD-10-CM | POA: Insufficient documentation

## 2014-06-24 NOTE — Progress Notes (Signed)
Patient ID: Dominique Martin, female   DOB: 12/20/1926, 81 y.o.   MRN: [id number]     Nursing Home Location:  [facility]   Place of Service: SNF (31)  Chief Complaint  Patient presents with  . Medical Management of Chronic Issues    HPI:  This is a 81 y.o. female residing at [facility] in the memory care section.  I am here to review their chronic medical issues. She has a hx of AD, HLD, essential tremor, OP, GERD, and HTN.  Her weight has decreased over time but has increased by 3 lbs from our last visit to 133 lbs.  She currently is incontinent and spends most of her time in the [facility]. She is able to ambulate with a walker with assistance (minimally). The resident wears a safety belt in the WC. She has some agitation with care but has not used any Ativan in the past 2 weeks per the [facility]. There are no complaints regarding her care today. Her last MMSE 12/2013 was 14/30, failed clock. Her last BIM score was 3/15 in Feb of 2016.   Per pharmacy recommendation Remeron was discontinued, as she was already on Zoloft and prn ativan. In the past there was concern that this led to agitation but I do not believe there was a correlation.   Review of Systems:  Review of Systems  Unable to perform ROS: Dementia  Genitourinary:       Intermittently incontinent  Musculoskeletal:       Uses WC  Psychiatric/Behavioral:       Sleepy during the day, up at night. Resistant to care at times.     Medications: Patient's Medications  New Prescriptions   No medications on file  Previous Medications   ACETAMINOPHEN (TYLENOL) 500 MG TABLET    Take 500 mg by mouth every 6 (six) hours as needed. Take 2 tablets (1000mg )  twice a day   ASPIRIN 81 MG TABLET    Take 81 mg by mouth daily.   CARBOXYMETHYLCELLULOSE SODIUM 0.25 % SOLN    Apply 1 drop to eye 2 (two) times daily.    DOCUSATE SODIUM (COLACE) 100 MG CAPSULE    Take 100 mg by mouth daily.    LACTOSE FREE NUTRITION  (BOOST PLUS) LIQD    Take 237 mLs by mouth daily. One serving daily   LORAZEPAM (ATIVAN) 0.5 MG TABLET    Take one tablet every 8 hours as needed for agitation   MEMANTINE HCL ER (NAMENDA XR) 28 MG CP24    Take 1 capsule by mouth every morning.    METOPROLOL TARTRATE (LOPRESSOR) 25 MG TABLET    Take 25 mg by mouth 2 (two) times daily.   OMEPRAZOLE (PRILOSEC) 20 MG CAPSULE    Take 20 mg by mouth daily. Take one daily to reduce stomach acid.   POLYETHYLENE GLYCOL (MIRALAX / GLYCOLAX) PACKET    Take 17 g by mouth daily.   SERTRALINE (ZOLOFT) 50 MG TABLET    Take 50 mg by mouth at bedtime.   Modified Medications   No medications on file  Discontinued Medications   MIRTAZAPINE (REMERON) 7.5 MG TABLET    Take 7.5 mg by mouth at bedtime.     Physical Exam:  Filed Vitals:   06/24/14 1536  BP: 120/58  Pulse: 80  Temp: 97.8 F (36.6 C)  Resp: 16  Weight: 133 lb 4.8 oz (60.464 kg)  SpO2: 95%   Physical Exam  Constitutional: No  distress.  HENT:  Head: Normocephalic.  Eyes: Pupils are equal, round, and reactive to light.  Neck: Neck supple. No thyromegaly present.  Cardiovascular: Exam reveals no gallop.   No murmur heard. Regular  Pulmonary/Chest: Effort normal and breath sounds normal.  Abdominal: Soft. Bowel sounds are normal. She exhibits no distension. There is no tenderness.  Musculoskeletal: Normal range of motion. She exhibits no edema or tenderness.  Lymphadenopathy:    She has no cervical adenopathy.  Neurological: No cranial nerve deficit.  Not oriented to self, location, date, situation. Pleasant, intermittently follows commands. Intentional tremor noted  Skin: Skin is warm and dry. No rash noted. She is not diaphoretic. No erythema.    Labs reviewed/Significant Diagnostic Results:  Basic Metabolic Panel:  Recent Labs  30/86/57  NA 140  K 3.9  BUN 17  CREATININE 0.7   Liver Function Tests:  Recent Labs  01/14/14  AST 14  ALT 12  ALKPHOS 78   No results  for input(s): LIPASE, AMYLASE in the last 8760 hours. No results for input(s): AMMONIA in the last 8760 hours. CBC:  Recent Labs  12/20/13 01/14/14  WBC 64.2 6.4  HGB 9.4* 12.7  HCT 28* 37  PLT 149* 238   CBG: No results for input(s): GLUCAP in the last 8760 hours. TSH:  Recent Labs  01/14/14  TSH 1.83   A1C: Lab Results  Component Value Date   HGBA1C 6.1* 04/21/2011    Assessment/Plan  1. Alzheimer's disease Progressive decline on Namenda. Requires more assistance and she is less mobile over time. Continue meds but will consider discontinuation in the future.  2. Essential hypertension Controlled. BMP monitored periodically and WNL  3. Gastroesophageal reflux disease without esophagitis Controlled on Prilosec without complaint.  4. Depression Continues on Zoloft with out obvious signs of depression, although this is difficult to assess. No issues since Remeron was discontinued thus far. Will monitor weight.  5. Constipation Stable on Colace and Miralax     Peggye Ley, ANP [facility] [phone]

## 2014-07-22 ENCOUNTER — Encounter: Payer: Self-pay | Admitting: Adult Health

## 2014-07-22 ENCOUNTER — Non-Acute Institutional Stay (SKILLED_NURSING_FACILITY): Payer: Medicare Other | Admitting: Adult Health

## 2014-07-22 DIAGNOSIS — I10 Essential (primary) hypertension: Secondary | ICD-10-CM

## 2014-07-22 DIAGNOSIS — G309 Alzheimer's disease, unspecified: Secondary | ICD-10-CM

## 2014-07-22 DIAGNOSIS — K5901 Slow transit constipation: Secondary | ICD-10-CM | POA: Diagnosis not present

## 2014-07-22 DIAGNOSIS — F329 Major depressive disorder, single episode, unspecified: Secondary | ICD-10-CM | POA: Diagnosis not present

## 2014-07-22 DIAGNOSIS — F32A Depression, unspecified: Secondary | ICD-10-CM

## 2014-07-22 DIAGNOSIS — R000 Tachycardia, unspecified: Secondary | ICD-10-CM | POA: Diagnosis not present

## 2014-07-22 DIAGNOSIS — K219 Gastro-esophageal reflux disease without esophagitis: Secondary | ICD-10-CM

## 2014-07-22 DIAGNOSIS — F0280 Dementia in other diseases classified elsewhere without behavioral disturbance: Secondary | ICD-10-CM

## 2014-07-22 NOTE — Progress Notes (Signed)
Patient ID: Dominique Martin, female   DOB: 11-Aug-1926, 81 y.o.   MRN: [id number]     Nursing Home Location:  [facility]   Place of Service: SNF (31)  Chief Complaint  Patient presents with  . Medical Management of Chronic Issues    HPI:  This is a 81 y.o. female residing at [facility] in the memory care section.  I am here to review their chronic medical issues. She has a hx of AD, HLD, essential tremor, OP, GERD, and HTN.  Her weight has decreased over time due to poor intake, currently at 129 lbs. She has been refusing meds for quite some time and so they were discontinued. She was on namenda and has had issues with agitation. This has not changed since the meds were discontinued on 07/15/14.  She is able to drink boost and takes miralax in the boost. She has prn ativan and tylenol still on the mar but has not used them. Her husband, Freida Busman, is aware of these changes.    Review of Systems:  Review of Systems  Unable to perform ROS: Dementia  Genitourinary:       Intermittently incontinent  Musculoskeletal:       Uses WC  Psychiatric/Behavioral:       Sleepy during the day, up at night. Resistant to care at times.     Medications: Patient's Medications  New Prescriptions   No medications on file  Previous Medications   ACETAMINOPHEN (TYLENOL) 500 MG TABLET    Take 1,000 mg by mouth 2 (two) times daily as needed. Take 2 tablets (1000mg )  twice a day   CARBOXYMETHYLCELLULOSE SODIUM 0.25 % SOLN    Apply 1 drop to eye 2 (two) times daily.    LACTOSE FREE NUTRITION (BOOST PLUS) LIQD    Take 237 mLs by mouth daily. One serving daily   LORAZEPAM (ATIVAN) 0.5 MG TABLET    Take one tablet every 8 hours as needed for agitation   POLYETHYLENE GLYCOL (MIRALAX / GLYCOLAX) PACKET    Take 17 g by mouth daily.  Modified Medications   No medications on file  Discontinued Medications   ASPIRIN 81 MG TABLET    Take 81 mg by mouth daily.   DOCUSATE  SODIUM (COLACE) 100 MG CAPSULE    Take 100 mg by mouth daily.    MEMANTINE HCL ER (NAMENDA XR) 28 MG CP24    Take 1 capsule by mouth every morning.    METOPROLOL TARTRATE (LOPRESSOR) 25 MG TABLET    Take 25 mg by mouth 2 (two) times daily.   OMEPRAZOLE (PRILOSEC) 20 MG CAPSULE    Take 20 mg by mouth daily. Take one daily to reduce stomach acid.   SERTRALINE (ZOLOFT) 50 MG TABLET    Take 50 mg by mouth at bedtime.      Physical Exam:  Filed Vitals:   07/22/14 1141  BP: 110/74  Pulse: 97  Temp: 97.1 F (36.2 C)  Resp: 18  Weight: 129 lb (58.514 kg)  SpO2: 99%   Physical Exam  Constitutional: No distress.  HENT:  Head: Normocephalic.  Eyes: Pupils are equal, round, and reactive to light.  Neck: Neck supple. No thyromegaly present.  Cardiovascular: Exam reveals no gallop.   No murmur heard. Regular  Pulmonary/Chest: Effort normal and breath sounds normal.  Abdominal: Soft. Bowel sounds are normal. She exhibits no distension. There is no tenderness.  Musculoskeletal: Normal range of motion. She exhibits no edema or  tenderness.  Lymphadenopathy:    She has no cervical adenopathy.  Neurological: No cranial nerve deficit.  Not oriented to self, location, date, situation. Pleasant, intermittently follows commands. Intentional tremor noted  Skin: Skin is warm and dry. No rash noted. She is not diaphoretic. No erythema.    Labs reviewed/Significant Diagnostic Results:  Basic Metabolic Panel:  Recent Labs  47/82/95  NA 140  K 3.9  BUN 17  CREATININE 0.7   Liver Function Tests:  Recent Labs  01/14/14  AST 14  ALT 12  ALKPHOS 78   No results for input(s): LIPASE, AMYLASE in the last 8760 hours. No results for input(s): AMMONIA in the last 8760 hours. CBC:  Recent Labs  12/20/13 01/14/14  WBC 64.2 6.4  HGB 9.4* 12.7  HCT 28* 37  PLT 149* 238   CBG: No results for input(s): GLUCAP in the last 8760 hours. TSH:  Recent Labs  01/14/14  TSH 1.83   A1C: Lab  Results  Component Value Date   HGBA1C 6.1* 04/21/2011    Assessment/Plan  1. Alzheimer's disease Progressive decline. Off namenda with no change in behavior or functional status. Continue to monitor.   2. Essential hypertension Controlled, no longer on toprol. HR and BP acceptable. Continue to monitor.  3. Gastroesophageal reflux disease without esophagitis Off prilosec. NO issues with swallowing or reflux per staff. This is difficult to assess due to her dementia.   4. Depression Off Zoloft with no increase in depressive symptoms. Monitor for signs of dementia  5. Constipation Continue miralax in boost.  6. Tachycardia HR acceptable off BB  Due to overall decline I think it is reasonable keep her off meds but we can attempt to add them back if she shows any signs agitation or depression or pain. Her goals of care are comfort driven.  Peggye Ley, ANP [facility] [phone]

## 2014-08-03 ENCOUNTER — Non-Acute Institutional Stay (SKILLED_NURSING_FACILITY): Payer: Medicare Other | Admitting: Internal Medicine

## 2014-08-03 DIAGNOSIS — G4700 Insomnia, unspecified: Secondary | ICD-10-CM | POA: Diagnosis not present

## 2014-08-03 DIAGNOSIS — G309 Alzheimer's disease, unspecified: Secondary | ICD-10-CM

## 2014-08-03 DIAGNOSIS — F0280 Dementia in other diseases classified elsewhere without behavioral disturbance: Secondary | ICD-10-CM

## 2014-08-03 DIAGNOSIS — G8929 Other chronic pain: Secondary | ICD-10-CM

## 2014-08-03 NOTE — Progress Notes (Signed)
Patient ID: Dominique Martin, female   DOB: [birth year], 81 y.o.   MRN: [id number]  Location:  [facility] Provider:  Artrice Kraker L. Renato Gails, D.O., C.M.D.  Code Status:  DNR Goals of care: Advanced Directive information Does patient have an advance directive?: Yes, Type of Advance Directive: Healthcare Power of [place];Living will;Out of facility DNR (pink MOST or yellow form), Pre-existing out of facility DNR order (yellow form or pink MOST form): Yellow form placed in chart (order not valid for inpatient use), Does patient want to make changes to advanced directive?: No - Patient declined  Chief Complaint  Patient presents with  . Acute Visit    not sleeping, was spitting out pills so liquid roxanol and ativan ordered     HPI:  81 yo white female with late stage Alzheimer's disease was seen for an acute visit due to difficulty sleeping recently.  She has been agitated lately and refusing her pills so they were changed to roxanol and liquid ativan.  She even refuses these at times.  Her husband is here visiting today. Nursing notes she is very restless all of the time even after she accepted the roxanol and ativan.    When seen, she was mumbling some and rocking back and forth in her wheelchair.  She had been in a recliner, but was not felt to be safe in there--nearly falling out--so she was moved to her wheelchair.  She is reaching down and rubbing her feet and lower legs.    Review of Systems: see hpi from nursing staff Review of Systems  Unable to perform ROS: dementia    Past Medical History  Diagnosis Date  . Vitamin D deficiency   . Hypercholesterolemia   . Alzheimer disease 2013  . Depression 2013  . BPPV (benign paroxysmal positional vertigo)   . Diverticulosis of colon (without mention of hemorrhage) 08/14/2011    Also ischemic colitis  . GERD (gastroesophageal reflux disease)   . Tremor, essential 2004  . Gait disorder   . Herpes zoster 2006  . Hypertension     . Osteoporosis     tx w/ bisphosphonate 2007-2013  . Pulmonary nodule/lesion, solitary 03/2011    5mm RUL nodule  . Vertebral compression fracture 03/2011    L1  . Vitamin D deficiency   . Herpes zoster without mention of complication 08/14/2011  . Anxiety state, unspecified 07/03/2011  . Urinary frequency 07/03/2011  . Chest pain, unspecified 05/21/2011  . External hemorrhoids without mention of complication 05/08/2011  . Migraine without aura, without mention of intractable migraine with status migrainosus 04/29/2011  . Pathologic fracture of vertebrae 04/29/2011    Patient Active Problem List   Diagnosis Date Noted  . Constipation 06/24/2014  . Tachycardia 04/16/2014  . Overactive bladder 11/25/2012  . Generalized anxiety disorder 05/13/2012    Class: Chronic  . Alzheimer's disease 05/13/2012  . Depression   . GERD (gastroesophageal reflux disease)   . Gait disorder   . Hypertension   . Gait abnormality 04/21/2011  . HYPERLIPIDEMIA 09/17/2008  . TREMOR, ESSENTIAL 09/17/2008  . DIVERTICULOSIS, COLON 09/17/2008  . Osteoporosis 09/17/2008    Allergies  Allergen Reactions  . Aricept [Donepezil Hydrochloride]   . Penicillins   . Tubersol [Tuberculin Ppd]     Medications: Patient's Medications  New Prescriptions   No medications on file  Previous Medications   ACETAMINOPHEN (TYLENOL) 500 MG TABLET    Take 1,000 mg by mouth 2 (two) times daily as needed. Take  2 tablets (1000mg )  twice a day   CARBOXYMETHYLCELLULOSE SODIUM 0.25 % SOLN    Apply 1 drop to eye 2 (two) times daily.    LACTOSE FREE NUTRITION (BOOST PLUS) LIQD    Take 237 mLs by mouth daily. One serving daily   LORAZEPAM (ATIVAN) 0.5 MG TABLET    Take one tablet every 8 hours as needed for agitation   POLYETHYLENE GLYCOL (MIRALAX / GLYCOLAX) PACKET    Take 17 g by mouth daily.  Modified Medications   No medications on file  Discontinued Medications   No medications on file    Physical Exam: Filed  Vitals:   08/03/14 0914  BP: 128/90  Pulse: 94  Temp: 97.6 F (36.4 C)  Resp: 18  Height: 5\' 3"  (1.6 m)  Weight: 122 lb (55.339 kg)  SpO2: 97%   Body mass index is 21.62 kg/(m^2).  Physical Exam  Constitutional: No distress.  Thin white female  HENT:  Head: Normocephalic and atraumatic.  Cardiovascular: Normal rate, regular rhythm, normal heart sounds and intact distal pulses.   Pulmonary/Chest: Effort normal and breath sounds normal.  Abdominal: Soft. Bowel sounds are normal. She exhibits no distension and no mass. There is no tenderness.  Neurological:  Does not really open her eyes much during visit, rocking back and forth in her wheelchair, looks uncomfortable, grabbing at her feet and legs and right thigh  Skin: Skin is warm and dry.    Labs reviewed: Basic Metabolic Panel:  Recent Labs  16/10/96  NA 140  K 3.9  BUN 17  CREATININE 0.7    Liver Function Tests:  Recent Labs  01/14/14  AST 14  ALT 12  ALKPHOS 78    CBC:  Recent Labs  12/20/13 01/14/14  WBC 64.2 6.4  HGB 9.4* 12.7  HCT 28* 37  PLT 149* 238    Lab Results  Component Value Date   TSH 1.83 01/14/2014   Lab Results  Component Value Date   HGBA1C 6.1* 04/21/2011   Lab Results  Component Value Date   CHOL 128 05/28/2012   HDL 46 05/28/2012   LDLCALC 86 05/28/2012   Labs were obtained 5/26 (cbc, chemistry, ua) all wnl  Patient Care Team: Kermit Balo, DO as PCP - General (Geriatric Medicine) [facility] Tia Alert, MD as Consulting Physician (Neurosurgery) Donzetta Starch, MD as Consulting Physician (Dermatology) Evie Lacks, MD as Consulting Physician (Neurology)  Assessment/Plan 1. Alzheimer's disease -appears end stage to me--minimally verbal, dependent in adls, not taking her pills so changed to liquid -she may benefit from hospice referral if she does not become more alert and interactive with more sleep  2. Chronic pain -will add butrans patch  weekly for her pain--unclear if this is related to her prior tendon ruptures or generalized OA at this point -seems uncomfortable and I think this is why she is not sleeping  3.  Insomnia -seems pain-related -will add butrans to her regimen to see if we can get pain under control and see it if helps sleep -can be titrated up to full benefit.  Family/ staff Communication: discussed with nurse in memory care  Labs/tests ordered:  No new labs, normal just a few weeks ago  Hilary Milks L. Tiran Sauseda, D.O. Geriatrics [facility] [address], [place] [address] Cell Phone (Mon-Fri 8am-5pm):  [phone] On Call:  [phone] & follow prompts after 5pm & weekends Office Phone:  [phone] Office Fax:  [phone]

## 2014-08-04 ENCOUNTER — Encounter: Payer: Self-pay | Admitting: Internal Medicine

## 2014-08-04 MED ORDER — BUPRENORPHINE 5 MCG/HR TD PTWK: 5.0000 ug | MEDICATED_PATCH | TRANSDERMAL | Status: DC

## 2014-08-05 ENCOUNTER — Non-Acute Institutional Stay (SKILLED_NURSING_FACILITY): Payer: Medicare Other | Admitting: Adult Health

## 2014-08-05 ENCOUNTER — Encounter: Payer: Self-pay | Admitting: Adult Health

## 2014-08-05 DIAGNOSIS — F0281 Dementia in other diseases classified elsewhere with behavioral disturbance: Secondary | ICD-10-CM | POA: Insufficient documentation

## 2014-08-05 DIAGNOSIS — G894 Chronic pain syndrome: Secondary | ICD-10-CM

## 2014-08-05 DIAGNOSIS — G308 Other Alzheimer's disease: Secondary | ICD-10-CM | POA: Diagnosis not present

## 2014-08-05 DIAGNOSIS — R410 Disorientation, unspecified: Secondary | ICD-10-CM | POA: Diagnosis not present

## 2014-08-05 DIAGNOSIS — G309 Alzheimer's disease, unspecified: Principal | ICD-10-CM

## 2014-08-05 NOTE — Progress Notes (Signed)
Patient ID: Dominique Martin, female   DOB: February 09, 1927, 81 y.o.   MRN: [id number]  Location:  [facility]   Code Status:  DNR Goals of care: Comfort  Chief Complaint  Patient presents with  . Acute Visit    increased pain and agitation    HPI:  81 yo white female with late stage Alzheimer's disease was seen for an acute visit due to uncontrolled pain and agitation. She has been losing weight and not eating well for several weeks. She has chronic pain related to OA and and a hx of compression fractures. She is non verbal and unable to communicate her needs. Her husband would like [place] to keep her comfortable without aggressive measures. She was started on butrans patch on 6/8 but this was discontinue due to cost.  Review of Systems: see hpi from nursing staff Review of Systems  Unable to perform ROS: dementia    Past Medical History  Diagnosis Date  . Vitamin D deficiency   . Hypercholesterolemia   . Alzheimer disease 2013  . Depression 2013  . BPPV (benign paroxysmal positional vertigo)   . Diverticulosis of colon (without mention of hemorrhage) 08/14/2011    Also ischemic colitis  . GERD (gastroesophageal reflux disease)   . Tremor, essential 2004  . Gait disorder   . Herpes zoster 2006  . Hypertension   . Osteoporosis     tx w/ bisphosphonate 2007-2013  . Pulmonary nodule/lesion, solitary 03/2011    83mm RUL nodule  . Vertebral compression fracture 03/2011    L1  . Vitamin D deficiency   . Herpes zoster without mention of complication 08/14/2011  . Anxiety state, unspecified 07/03/2011  . Urinary frequency 07/03/2011  . Chest pain, unspecified 05/21/2011  . External hemorrhoids without mention of complication 05/08/2011  . Migraine without aura, without mention of intractable migraine with status migrainosus 04/29/2011  . Pathologic fracture of vertebrae 04/29/2011    Patient Active Problem List   Diagnosis Date Noted  . Alzheimer's dementia with  behavioral disturbance 08/05/2014  . Constipation 06/24/2014  . Tachycardia 04/16/2014  . Overactive bladder 11/25/2012  . Generalized anxiety disorder 05/13/2012    Class: Chronic  . Alzheimer's disease 05/13/2012  . Depression   . GERD (gastroesophageal reflux disease)   . Gait disorder   . Hypertension   . Gait abnormality 04/21/2011  . HYPERLIPIDEMIA 09/17/2008  . TREMOR, ESSENTIAL 09/17/2008  . DIVERTICULOSIS, COLON 09/17/2008  . Osteoporosis 09/17/2008    Allergies  Allergen Reactions  . Aricept [Donepezil Hydrochloride]   . Penicillins   . Tubersol [Tuberculin Ppd]     Medications: Patient's Medications  New Prescriptions   No medications on file  Previous Medications   ACETAMINOPHEN (TYLENOL) 500 MG TABLET    Take 1,000 mg by mouth 2 (two) times daily as needed. Take 2 tablets (1000mg )  twice a day   CARBOXYMETHYLCELLULOSE SODIUM 0.25 % SOLN    Apply 1 drop to eye 2 (two) times daily.    LACTOSE FREE NUTRITION (BOOST PLUS) LIQD    Take 237 mLs by mouth daily. One serving daily   LORAZEPAM (ATIVAN) 0.5 MG TABLET    Take one tablet every 8 hours as needed for agitation   MORPHINE (ROXANOL) 20 MG/ML CONCENTRATED SOLUTION    Take 10 mg by mouth 3 (three) times daily. And q2 hrs prn pain   POLYETHYLENE GLYCOL (MIRALAX / GLYCOLAX) PACKET    Take 17 g by mouth daily.  Modified Medications  No medications on file  Discontinued Medications   BUPRENORPHINE (BUTRANS) 5 MCG/HR PTWK PATCH    Place 1 patch (5 mcg total) onto the skin once a week.    Physical Exam: Filed Vitals:   08/05/14 1351  BP: 121/78  Pulse: 120  Resp: 22   There is no weight on file to calculate BMI.  Physical Exam  Constitutional: No distress.  Thin white female  HENT:  Head: Normocephalic and atraumatic.  Cardiovascular: Regular rhythm, normal heart sounds and intact distal pulses.   Elevated hr  Pulmonary/Chest: Effort normal and breath sounds normal.  Abdominal: Soft. Bowel sounds are  normal. She exhibits no distension and no mass. There is no tenderness.  Neurological:  Non verbal, not able to f/c or open her eyes. No obvious focal deficit.   Skin: Skin is warm and dry.    Labs reviewed: Basic Metabolic Panel:  Recent Labs  21/30/86  NA 140  K 3.9  BUN 17  CREATININE 0.7    Liver Function Tests:  Recent Labs  01/14/14  AST 14  ALT 12  ALKPHOS 78    CBC:  Recent Labs  12/20/13 01/14/14  WBC 64.2 6.4  HGB 9.4* 12.7  HCT 28* 37  PLT 149* 238    Lab Results  Component Value Date   TSH 1.83 01/14/2014   Lab Results  Component Value Date   HGBA1C 6.1* 04/21/2011   Lab Results  Component Value Date   CHOL 128 05/28/2012   HDL 46 05/28/2012   LDLCALC 86 05/28/2012   Labs were obtained 5/26 (cbc, chemistry, ua) all wnl  Patient Care Team: Kermit Balo, DO as PCP - General (Geriatric Medicine) [facility] Tia Alert, MD as Consulting Physician (Neurosurgery) Donzetta Starch, MD as Consulting Physician (Dermatology) Evie Lacks, MD as Consulting Physician (Neurology)  Assessment/Plan  1. Alzheimer's dementia with behavioral disturbance End stage with comfort care measures in place. She is agitated and moving in the recliner quite a bit. She may be in pain as well. See below.   2. Acute delirium Increase ativan to 0.5 mg q 2 hrs prn agitaiton  3. Chronic pain Increase Roxanol to 10 mg TID scheduled and q 2 hrs prn pain     Peggye Ley, ANP [facility] [phone]

## 2014-08-27 DEATH — deceased

## 2015-01-06 NOTE — Progress Notes (Signed)
This encounter was created in error - please disregard.

## 2015-02-08 NOTE — Progress Notes (Signed)
This encounter was created in error - please disregard.
